# Patient Record
Sex: Female | Born: 1950 | Race: White | Marital: Married | Smoking: Never smoker
Health system: Northeastern US, Academic
[De-identification: ages and names within clinical notes are randomized; demographics above are authoritative.]

## PROBLEM LIST (undated history)

## (undated) DIAGNOSIS — E213 Hyperparathyroidism, unspecified: Secondary | ICD-10-CM

## (undated) HISTORY — DX: Hyperparathyroidism, unspecified: E21.3

---

## 2013-01-03 ENCOUNTER — Encounter: Payer: Self-pay | Admitting: Gastroenterology

## 2013-01-17 ENCOUNTER — Ambulatory Visit: Payer: Self-pay | Admitting: Surgical Oncology

## 2013-01-17 VITALS — BP 130/78 | Ht 65.55 in | Wt 133.4 lb

## 2013-01-17 DIAGNOSIS — M810 Age-related osteoporosis without current pathological fracture: Secondary | ICD-10-CM

## 2013-01-17 LAB — PTH, INTACT: Intact PTH: 59.6 pg/mL (ref 15.0–65.0)

## 2013-01-18 ENCOUNTER — Ambulatory Visit
Admit: 2013-01-18 | Discharge: 2013-01-18 | Disposition: A | Discharge status: Home / Self Care | Payer: Self-pay | Source: Ambulatory Visit | Attending: Surgical Oncology | Admitting: Surgical Oncology

## 2013-01-18 DIAGNOSIS — M810 Age-related osteoporosis without current pathological fracture: Secondary | ICD-10-CM

## 2013-01-18 LAB — ALKALINE PHOSPHATASE, BONE SPECIFIC: Alk Phos Bone Fract: 15.9 ug/L

## 2013-01-18 NOTE — Progress Notes (Signed)
This office note has been dictated.

## 2013-01-20 LAB — N-TELOPEPTIDE, URINE
Creat,Ur: 103 mg/dL
NTX Telopeptide: 65

## 2013-01-20 NOTE — Progress Notes (Signed)
PATIENTELENOR, [name]  MR #:  [id number]   ACCOUNT #:  [id number] DOB:  Jun 29, 1951   DICTATED BY:  Bebe Shaggy, PA DATE OF VISIT:  01/17/2013     CHIEF COMPLAINT:  Osteoporosis     REFERRING PHYSICIAN/PROVIDER:  Tomi Bamberger, M. D.     HISTORY OF PRESENT ILLNESS:  This pleasant 62 year old female was kindly referred for further evaluation regarding osteoporosis treatment.  She had been on Fosamax for several years but discontinued this medication approximately four years ago due to the length of time she had been on the medication.  She had a series of lab work done by Dr. Flonnie Overman recently and was found to have a low vitamin D level and was asked to start taking 2000 iu daily.  She does take calcium +D at 600 mg twice daily.  The patient has a history of pancreatitis, which she was in the hospital for approximately three years ago.  At that time, she was told that she had a high calcium level, but she is unaware of any further workup to come from that time.  She has not had any fragility fractures.  She is very active with walking as well as doing yoga.       PAST MEDICAL HISTORY:   1. Intestinal blockage - about 2005  2. Osteoporosis   3. Pancreatitis  4. Mononucleosis in 1972  5. Hepatitis in 1972      PAST SURGICAL HISTORY:   1. 1997 - Hysterectomy  2. 2008 - Knee arthroscopy  3. 2011 - Cholecystectomy      MEDICATIONS: Vitamins only.     ALLERGIES:  No known drug allergies and no known latex allergy.         FAMILY HISTORY:  Both parents deceased at unstated ages.  Mother had congestive heart failure; father had Alzheimer's and heart failure.  Two siblings, alive, with no reported medical problems. No children.        SOCIAL HISTORY:  She is married, does not live alone, and is retired.  Denies tobacco and recreational drug use; has one alcoholic drink per day.        REVIEW OF SYSTEMS:  All reported as negative.               OSTEOPOROSIS-RELATED HISTORY:  As noted, she was on Fosamax for several  years and came off it about four years ago.  She had a bone density scan done this month (see below).  Menarche around age 56 with history of regular menses. Menopause in 1997.  No pregnancies.  She was on estrogen replacement beginning in 1997 until an unknown date.  She has never taken steroids.  Takes in 1200 mg of calcium daily and very recently upped her vitamin D intake from 400 iu to 2000 iu daily. Drinks 2-3 cups of coffee per day and one to two cups of tea per week.  Has never had chemotherapy or taken anticonvulsants or diuretics.  Mother had osteoporosis; father did not.  No parental history of broken bones.  Reports a one-half inch loss in height.  Exercises by walking 3-4 miles five days per week and doing yoga twice weekly.  Diet is lactose intolerant and includes one to two servings of calcium-rich foods per day.       PHYSICAL EXAMINATION: 62 year old post-menopausal woman, alert and oriented x3 and in no acute distress.  HEENT is within normal limits.  No evidence of blue-tinted sclera or lucent  dentitia.  Spine demonstrates no noted kyphosis or scoliosis.  No pain on palpation of the spine. She walks with a steady gait and has excellent balance.  She can forward flex fingertips to toes.  Full range of motion in all extremities.  No signs of hypermobility.  Motor strength is 5/5 throughout.  Neurovascularly intact.  No signs of collagen variance.        BONE DENSITY SCAN:  This was done at [facility] on 01/03/13 and demonstrates that:             AP spine L1-L4 BMD is 0.967 with a T-score of -0.7.  This shows an 11% decline since her 2010 scan.             Right femoral neck BMD is 0.510 with a T-score of -3.0.               Right trochanter BMD is 0.587 with a T-score of -1.1.             Right total hip BMD is 0.706 with a T-score of -1.8.     METABOLIC LABS:  01/03/13             Calcium:          10.8             Vitamin D 25 OH:         23.5     ASSESSMENT: 62 year old, post-menopausal woman with  osteoporosis.  She does have evidence of hypercalcemia on the recent labs that Dr. Flonnie Overman had performed as well as the low vitamin D.     PLAN:  I have reviewed the above abnormalities with Dr. Evalee Mutton today.  As the patient is taking 1200 mg of calcium +D, we have asked her to cut this down to 600 mg of calcium once daily.  We are going to ask that she perform a 24-hour urine calcium as well as a PTH, NTX, and  BSAP  to assess for any secondary causes of the hypercalcemia at this time.  She will continue her current vitamin D3 of 2000 iu daily.  She will follow up with Dr. Evalee Mutton in approximately three to four weeks prior to her departure for [place].  Should she have any questions or concerns in the meantime, she may call the office. She was informed that she needs to have the lab work done at least two weeks prior to the follow-up visit.  We reviewed that there may be a potential of cycling her back onto Fosamax, which she took several years ago; it would be for a shorter duration on this cycle.  I've also discussed that sometimes elevated calcium levels can indicate hyperparathyroidism, and these labs will hopefully detect this if that is the case.  If so, the hyperparathyroidism would need to be addressed before proceeding with additional treatment options for the osteoporosis.       40 minutes were spent with the patient, more than 50% in counseling.             ______________________________  Bebe Shaggy, PA    ASN/MODL  DD:  01/19/2013 12:28:01  DT:  01/20/2013 05:33:41  Job #:  [id number]    cc: Bebe Shaggy, PA   [facility]   [phone] [place] [address]     Damien Fusi, MD   [address] [place], [place] [address]

## 2013-01-21 ENCOUNTER — Ambulatory Visit
Admit: 2013-01-21 | Discharge: 2013-01-21 | Disposition: A | Discharge status: Home / Self Care | Payer: Self-pay | Source: Ambulatory Visit | Attending: Orthopedic Surgery | Admitting: Orthopedic Surgery

## 2013-01-21 LAB — CALCIUM, URINE
Calcium,Ur: 14.6 mg/dL
Calcium/24H,UR: 423 mg/24hr — ABNORMAL HIGH (ref 100–240)
Collection Period: 24 h
Time End: 750
Time Start: 750
Total Volume,UR: 2900 mL

## 2013-01-24 ENCOUNTER — Encounter: Payer: Self-pay | Admitting: Orthopedic Surgery

## 2013-01-24 ENCOUNTER — Ambulatory Visit: Payer: Self-pay | Admitting: Orthopedic Surgery

## 2013-01-24 NOTE — Progress Notes (Signed)
CHIEF COMPLAINT: Osteoporosis       HISTORY OF PRESENT ILLNESS: This pleasant 62 year old female was kindly referred for further evaluation regarding osteoporosis treatment. She had been on Fosamax for several years but discontinued this medication approximately four years ago due to the length of time she had been on the medication. She had a series of lab work done by Dr. Flonnie Overman recently and was found to have a low vitamin D level and was asked to start taking 2000 iu daily. She does take calcium +D at 600 mg twice daily. The patient has a history of pancreatitis, which she was in the hospital for approximately three years ago. At that time, she was told that she had a high calcium level, but she is unaware of any further workup to come from that time. She has not had any fragility fractures. She is very active with walking as well as doing yoga.     PAST MEDICAL HISTORY:   1. Intestinal blockage - about 2005  2. Osteoporosis   3. Pancreatitis  4. Mononucleosis in 1972  5. Hepatitis in 1972   6.   PAST SURGICAL HISTORY:   1. 1997 - Hysterectomy  2. 2008 - Knee arthroscopy  3. 2011 - Cholecystectomy       MEDICATIONS: Vitamins only.   ALLERGIES: No known drug allergies and no known latex allergy.     FAMILY HISTORY: Both parents deceased at unstated ages. Mother had congestive heart failure; father had Alzheimer's and heart failure. Two siblings, alive, with no reported medical problems. No children.   SOCIAL HISTORY: She is married, does not live alone, and is retired. Denies tobacco and recreational drug use; has one alcoholic drink per day.     REVIEW OF SYSTEMS: All reported as negative.     OSTEOPOROSIS-RELATED HISTORY: As noted, she was on Fosamax for several years and came off it about four years ago. She had a bone density scan done this month (see below). Menarche around age 23 with history of regular menses. Menopause in 1997. No pregnancies. She was on estrogen replacement beginning in 1997 until an  unknown date. She has never taken steroids. Takes in 1200 mg of calcium daily and very recently upped her vitamin D intake from 400 iu to 2000 iu daily. Drinks 2-3 cups of coffee per day and one to two cups of tea per week. Has never had chemotherapy or taken anticonvulsants or diuretics. Mother had osteoporosis; father did not. No parental history of broken bones. Reports a one-half inch loss in height. Exercises by walking 3-4 miles five days per week and doing yoga twice weekly. Diet is lactose intolerant and includes one to two servings of calcium-rich foods per day.     PHYSICAL EXAMINATION: 62 year old post-menopausal woman, alert and oriented x3 and in no acute distress. Vitals signs documetned in e-record. HEENT is within normal limits. No evidence of blue-tinted sclera or lucent dentitia. Spine demonstrates no noted kyphosis or scoliosis. She walks with a steady gait and has excellent balance. Full range of motion in all extremities. No signs of hypermobility.     BONE DENSITY SCAN: This was done at [facility] on 01/03/13 and demonstrates that:   AP spine L1-L4 BMD is 0.967 with a T-score of -0.7. This shows an 11% decline since her 2010 scan.   Right femoral neck BMD is 0.510 with a T-score of -3.0.   Right trochanter BMD is 0.587 with a T-score of -1.1.   Right total hip  BMD is 0.706 with a T-score of -1.8.     METABOLIC LABS: 01/03/13   Calcium: 10.8   Vitamin D 25 OH: 23.5   PTH: 59.6  24 urine: 423  NTX: 65      ASSESSMENT: 62 year old, post-menopausal woman with osteoporosis. She does have evidence of hypercalcemia and very high urine calcium.    PLAN: I am going to stop her calcium and Vit D at this time. I am going to refer her to endocrinology and we will hold off starting any treatment for her osteoporosis until she is evaluated by endocrinology. She will follow-up after eval by endocrine.    Over 25 minutes was spent in face to face time with the patient today and >50% was in counseling and  coordination of care.

## 2013-01-25 ENCOUNTER — Telehealth: Payer: Self-pay

## 2013-01-25 NOTE — Telephone Encounter (Signed)
Carmodysoni, Gerome Apley, MD is referring pt for hypercalcemia  Review- Chart view

## 2013-01-26 NOTE — Telephone Encounter (Signed)
NA

## 2013-01-27 NOTE — Telephone Encounter (Signed)
1st attempt left message on patient voicemail

## 2013-01-30 NOTE — Telephone Encounter (Signed)
Spoke with pt scheduled her for 2/5@ 1320 with Dr. Annye Rusk  Referring office notified

## 2013-02-01 ENCOUNTER — Ambulatory Visit: Payer: Self-pay | Admitting: Endocrinology

## 2013-02-01 ENCOUNTER — Encounter: Payer: Self-pay | Admitting: Endocrinology

## 2013-02-01 VITALS — BP 163/89 | HR 91 | Ht 66.0 in | Wt 133.0 lb

## 2013-02-01 DIAGNOSIS — E210 Primary hyperparathyroidism: Secondary | ICD-10-CM | POA: Insufficient documentation

## 2013-02-01 LAB — BASIC METABOLIC PANEL
Anion Gap: 9 (ref 7–16)
CO2: 29 mmol/L — ABNORMAL HIGH (ref 20–28)
Calcium: 10.4 mg/dL — ABNORMAL HIGH (ref 8.6–10.2)
Chloride: 103 mmol/L (ref 96–108)
Creatinine: 0.78 mg/dL (ref 0.51–0.95)
GFR,Black: 96 *
GFR,Caucasian: 83 *
Glucose: 83 mg/dL (ref 60–99)
Lab: 14 mg/dL (ref 6–20)
Potassium: 5.3 mmol/L — ABNORMAL HIGH (ref 3.3–5.1)
Sodium: 141 mmol/L (ref 133–145)

## 2013-02-01 LAB — PHOSPHORUS: Phosphorus: 3.4 mg/dL (ref 2.7–4.5)

## 2013-02-01 LAB — PTH, INTACT: Intact PTH: 77.9 pg/mL — ABNORMAL HIGH (ref 15.0–65.0)

## 2013-02-01 LAB — ALBUMIN: Albumin: 4.8 g/dL (ref 3.5–5.2)

## 2013-02-01 NOTE — Progress Notes (Signed)
Endocrine Consult Note  [name] is a 62 y.o.  woman seen on 02/01/2013 for consultation regarding hypercalcemia and osteoporosis.    History of Present Illness:  In Jan 2011, adm at [facility] for pancreatitis due to gallstones - Dr Miquel Dunn; treated with med for pancreatitis and was told calcium was "really high" - was on cal BID (total 1200 mg/day) and D 400 every day.    Has had at least 3, maybe 4, DXAs, at [facility]; was told had areas of osteoporosis (OP) and osteopenia. (recently changed to Dr Shela Commons. Flonnie Overman)    Took alen for several yrs (one note states started 2005), stopped about 4 yrs ago (2009-10) after discussing with Dr Marcy Salvo (PCP at the time). Not on any OP meds since.    Off cal and D since last Tues after seeing Dr Evalee Mutton.    Has not been told of high cal since [facility] 3 yrs ago until Jan 2014.    Had thyroid checked by prev PCP due to hair loss. Was told thyroid levels were normal. Has never needed thyroid medication.    This was 1st 24 hr urine collection. No h/o kidney stones.     No fractures.     1997 - complete hysterectomy with BSO for endometriosis. Took estradiol for sev yrs, gradually weaned off. Has been off for at least 5 yrs.    No known fam with high cal or kidney stones. Mother with osteoporosis; no known fx.    Planning to go to [facility] for 6 weeks in late Feb.    Patient's medications, allergies, past medical, surgical, social and family histories were reviewed and updated as appropriate.     Review of Systems:  Constitutional: fever: no, chills: no, excessive fatigue: no, unintentional weight gain: no, night sweats: yes, and unintentional weight loss: no.  Eyes: blurry vision: no, redness: no, double vision: no, dryness: yes, R eye; irritation: no, and loss of vision: no.  Ear/nose/throat: dry mouth: no, runny nose: no, ringing in ears: no, change in voice: no, sore throat: no, mouth sore: no, heavy snoring: no, hearing loss: no, and sinus congestion: no.  Heart/Chest:  discomfort: no, pressure: no, rapid heart rate: no, calf pain with walking: no, fainting: no, and leg swelling: no.  Lungs/breathing: cough: no, wheezing: no, difficulty breathing: no, and sputum: no.  Stomach/bowel: diarrhea: no, constipation: no, nausea/vomiting: no, belly pain: no, heart burn: no, change in appetite: no, difficulty swallowing: no, and blood in stool/black stool: no.  Kidneys: difficulty with urination: no, very thirsty: no, pain with urination: no, frequent urination: no, blood in urine: no, urination at night: no, incomplete emptying: no, and leakage of urine: no.  Breast: pain: no and discharge: no.  Female/female organs: irregular period: no, decreased libido: no, and decreased sexual function: yes  Muscles: decreased strength: no and cramping/pain: no.  Skin: rash: no, ulcer: no, stretch marks: no, callous: no, and new/changing moles: no.  Neurological: memory loss: no, tremor: no, muscle weakness: no, involuntary movement: no, falls: no, dizziness: no, numbness/tingling: no, and frequent/severe headaches: no.  Psychosocial: insomnia: occa, feeling sad or depressed: no, panic: no, and anxiety/nervousness: no.  Endocrine: hot flashes: yes, excessive thirst: no, and heat/cold intolerance: no.  Blood/lymphatic: easy bleeding: no, anemia: no, swollen lymph nodes: no, and excessive bruising: no.  Allergy: severe allergic reactions: no, hives: no, and frequent infections: no.    Physical Exam:  Blood pressure 163/89, pulse 91, height 1.676 m (5\' 6" ), weight 60.328  kg (133 lb).  GENERAL APPEARANCE:  NAD, pleasant  HEENT:  EOMI, no exophthalmos, sclerae clear.  NECK: supple, no thyromegaly or lymphadenopathy. No palpable thyroid nodules.  CHEST:  lungs clear, no rales, rhonchi or wheezes  HEART:  RRR, normal s1, s2  BACK:  no spinal or CVA tenderness  ABDOMEN: soft, NT, ND, BS positive  EXTREMITIES: no edema  NEUROLOGICAL: alert, oriented, no tremors with extended arms.    SKIN: normal thickness, no  striae, rashes    Labs and Studies:  01/03/13 OSH DXA: R fem neck T -3, R tot hip -1.8, L1-L4 -0.7. OSH labs: Cal 10.8 (8.5-10.4), alb 4.8, Cr 0.7, 25D 23.5  01/17/13: [place] PTH 59.6 (no concomitant cal)  01/18/13: NTX 65  01/21/13: 24hr ur cal 423 mg/d.    Assessment and Plan:  Ms [name] is a 62 y.o. lady with h/o osteoporosis, found to have by hypercalcemic.    1. Hypercalcemia. Discussed that she most likely has primary hyperparathyroidism (PHPT) given that she has hypercalcemia, and a PTH done was in the high normal range (though the PTH should have been accompanied by a concomitant calcium).  - Reviewed that PHPT is assoc with bone loss/osteoporosis, hypercalciuria/nephrolithiasis, and potential cardiac and neurological symptoms.  - While her calcium was not dangerously high, she has other signs of PHPT - osteoporosis and hypercalciuria.   - Thus discussed that the main treatment for PHPT is surgical and so will refer her to Dr Judge Stall, whose practice focuses of endocrine surgery. Greatly apprec his expertise.  - Will check labs today to ensure her cal has not cont'd to climb.  - Discussed that since she has had hypercalcemia for several years, she can likely go to [facility] and then return for surgery. However, will be able to determine this better after her labs, and she will also discuss with Dr Charisse Klinefelter.  - Very briefly discussed imaging studies like USG and sestamibi scan, but will defer to Dr Charisse Klinefelter for imaging recs.    2. Osteoporosis. S/p alendronate for several yrs. On her most recent DXA from 01/03/13, only the R fem neck was osteoporotic but her hip was osteopenic. Would hold on addn osteoporosis treatment pending mgmt of the PHPT.    We will determine her follow up with me after the results are available.    Labs soon:   Orders Placed This Encounter   Procedures   . Basic metabolic panel   . Vitamin d 25 hydroxy, d2&d3   . PTH, intact   . Albumin   . Phosphorus     Labs before next appt: TBD

## 2013-02-01 NOTE — Patient Instructions (Addendum)
I will get in touch with Dr Judge Stall, the parathyroid surgeon, and his office will get in touch with you about making an appt.    We will check labs today - ok that they're not fasting.    For now, remain off calcium and vit D.    Please call with questions: Dr Alesia Banda, phone [phone]

## 2013-02-04 ENCOUNTER — Encounter (INDEPENDENT_AMBULATORY_CARE_PROVIDER_SITE_OTHER): Payer: Self-pay

## 2013-02-06 ENCOUNTER — Ambulatory Visit: Payer: Self-pay | Admitting: Surgical Oncology

## 2013-02-06 ENCOUNTER — Encounter: Payer: Self-pay | Admitting: Surgical Oncology

## 2013-02-06 VITALS — BP 159/73 | HR 72 | Temp 97.4°F | Resp 16 | Ht 66.0 in | Wt 132.0 lb

## 2013-02-06 DIAGNOSIS — E210 Primary hyperparathyroidism: Secondary | ICD-10-CM

## 2013-02-06 LAB — VITAMIN D
25-OH VIT D2: <4 ng/mL
25-OH VIT D3: 22 ng/mL
25-OH Vit Total: 22 ng/mL — ABNORMAL LOW (ref 30–60)

## 2013-02-06 NOTE — Progress Notes (Signed)
Dear DR. Annye Burns,    It was my pleasure to meet [name] in consultation regarding [name] elevated parathyroid level. Thank you for referring [name] to my endocrine surgery practice. Since you know [name] so well, please allow me the privilege of reviewing [name] history for the completeness of my own office record.    [name] complains of osteoporosis/osteopenia.    [name]  has a past medical history of Hyperparathyroidism.    She  has past surgical history that includes Hysterectomy, total abdominal.    She has a current medication list which includes the following prescription(s): lysine, glucosamine 1500 complex, vitamin c, aspirin, calcium-vitamin d, and cholecalciferol.    She has No Known Allergies (drug, envir, food or latex).    She  reports that she has never smoked. She does not have any smokeless tobacco history on file.    [name]  reports that she does not drink alcohol.    I did a comprehensive review of systems using the Pre Op H&P note template.    On physical examination, this is a well-developed, well-nourished female who is in no acute distress.  The patient communicates clearly, and has a normal affect. [name] body mass index is 21.32 kg/(m^2). [name]'s  height is 1.676 m (5\' 6" ) and weight is 59.875 kg (132 lb). [name] temporal temperature is 36.3 C (97.4 F). [name] blood pressure is 159/73 and [name] pulse is 72. [name] respiration is 16.  There is no proptosis or exophthalmos. The thyroid gland is normal is not palpable. There is no palpable lymphadenopathy. [name]'s sign is negative. Lungs are clear. Heart is regular. Abdomen is soft, nontender, and nondistended without organomegaly. There is no lower extremity edema and no rashes.  Rectal and breast exams are deferred. Neurologically, the patient is grossly intact.    Review of pertinent laboratory values is as follows:  01/21/2013: Calcium/24H,UR 423 mg/24hr* (High; Range: 100 - 240 mg/24hr)    02/01/2013: Calcium 10.4 mg/dL* (High; Range: 8.6 - 16.1 mg/dL)       I  reviewed [name] laboratory studies with [name] in person. Next, I went on to discuss with [name] the physiologic function of the parathyroid gland and the difference between primary and secondary hyperparathyroidism. I emphasized to [name] that the diagnosis of primary versus secondary hyperparathyroidism is often difficult to establish definitively.      I had a very nice and detailed discussion with [name]/[name] family. We discussed [name] options in the management of this problem - monitoring with repeated blood work, ultrasound, and other imaging studies. Additionally, the risks and benefits of thyroid surgery, including scar formation, postoperative bleeding, recurrent laryngeal nerve injury (unilateral and bilateral) and hypoparathyroidism, as well as the ramifications of these complications, were all discussed in great detail with the patient who understood that these complications are uncommon, but nevertheless do occur despite every effort at their prevention.      At the conclusion of the visit, the patient was much better informed regarding the nature of [name] problem.      At the conclusion of the visit, [name] wished to proceed with left lower parathyroid surgery tomorrow. Of course, I will keep you posted on my ongoing findings and on the patient's condition.    Thank you very much for sending this delightful patient to me and for allowing me to participate in [name] care.  I will keep you up-to-date on [name] progress.      Best wishes,  Theresa Denver, NP  Addendum [name])    I agree with [name]'s note as dictated above.  It is reflective of my history and physical and assessment of [name].  Given [name] osteoporosis, surgery is clearly appropriate.   Will do the case tomorrow at [facility].  In the office, I did an US exam.  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      After a detailed conversation which included the risks, benefits, and  alternatives to surgery, [name] wished for the operation to take place soon.   Specifically, we discussed the risks of scar formation (although every effort will be made to minimize the cosmetic impact of the surgery), bleeding (which occurs in far less than 1% of patients).  Additionally, we discussed the risks and ramifications of RLN injury, which occurs in less than 1% of patients. Finally, I explained that traditional parathyroidectomy was curative in 97% of patients, and that minimal access surgery, which I plan for [name], approximates this nicely, with a 96% cure rate.      Surgery will take place tomorrow per [name] wishes.  Of course, I will keep you updated on [name] progress.      Many thanks for referring this delightful patient to me,     Sincerely,    Theresa Stall, MD, [facility]  Assistant Professor of Endocrine Surgery and Endocrinology  [facility]  [address], [place], [place]  [address]  [phone] - office  [phone] - fax

## 2013-02-07 ENCOUNTER — Other Ambulatory Visit: Payer: Self-pay | Admitting: Gastroenterology

## 2013-02-07 ENCOUNTER — Ambulatory Visit: Payer: Self-pay | Admitting: Orthopedic Surgery

## 2013-02-07 ENCOUNTER — Ambulatory Visit
Admit: 2013-02-07 | Disposition: A | Discharge status: Home / Self Care | Payer: Self-pay | Source: Ambulatory Visit | Attending: Surgical Oncology | Admitting: Surgical Oncology

## 2013-02-07 LAB — COMPREHENSIVE METABOLIC PANEL
ALT: 25 U/L (ref 0–35)
AST: 26 U/L (ref 0–35)
Albumin: 4.9 g/dL (ref 3.5–5.2)
Alk Phos: 69 U/L (ref 35–105)
Anion Gap: 11 (ref 7–16)
Bilirubin,Total: 0.6 mg/dL (ref 0.0–1.2)
CO2: 27 mmol/L (ref 20–28)
Calcium: 9.8 mg/dL (ref 8.6–10.2)
Chloride: 103 mmol/L (ref 96–108)
Creatinine: 0.64 mg/dL (ref 0.51–0.95)
GFR,Black: 113 *
GFR,Caucasian: 98 *
Globulin: 2.5 g/dL — ABNORMAL LOW (ref 2.7–4.3)
Glucose: 95 mg/dL (ref 60–99)
Lab: 15 mg/dL (ref 6–20)
Potassium: 4.3 mmol/L (ref 3.3–5.1)
Sodium: 141 mmol/L (ref 133–145)
Total Protein: 7.4 g/dL (ref 6.3–7.7)

## 2013-02-07 LAB — RAPID PTH
Intraoperative PTH: 57.3 pg/mL (ref 15.0–65.0)
Intraoperative PTH: 29 pg/mL (ref 15.0–65.0)
Intraoperative PTH: 37.9 pg/mL (ref 15.0–65.0)
Intraoperative PTH: 51.1 pg/mL (ref 15.0–65.0)
Intraoperative PTH: 93.5 pg/mL — ABNORMAL HIGH (ref 15.0–65.0)

## 2013-02-07 LAB — CBC
Hematocrit: 40 % (ref 34–45)
Hemoglobin: 12.9 g/dL (ref 11.2–15.7)
MCV: 94 fL (ref 79–95)
Platelets: 206 10*3/uL (ref 160–370)
RBC: 4.3 MIL/uL (ref 3.9–5.2)
RDW: 13.3 % (ref 11.7–14.4)
WBC: 9.5 10*3/uL (ref 4.0–10.0)

## 2013-02-07 LAB — EKG 12-LEAD
P: 67 degrees
QRS: 91 degrees
Rate: 76 {beats}/min
Statement: BORDERLINE
T: 15 degrees

## 2013-02-07 LAB — CALCIUM: Calcium: 9.3 mg/dL (ref 8.6–10.2)

## 2013-02-07 LAB — PTH, INTACT: Intact PTH: 12.6 pg/mL — ABNORMAL LOW (ref 15.0–65.0)

## 2013-02-07 MED ORDER — CEFAZOLIN SODIUM-2000 MG DEXTROSE 3% DUPLEX IV *I*
2000.0000 mg | INTRAVENOUS | Status: AC
Start: 2013-02-07 — End: 2013-02-07
  Administered 2013-02-07: 2000 mg via INTRAVENOUS

## 2013-02-07 MED ORDER — CEFAZOLIN SODIUM 1000 MG IJ SOLR *I*
INTRAMUSCULAR | Status: AC
Start: 2013-02-07 — End: 2013-02-07
  Filled 2013-02-07: qty 20

## 2013-02-07 MED ORDER — LACTATED RINGERS IV SOLN *I*
20.0000 mL/h | INTRAVENOUS | Status: DC
Start: 2013-02-07 — End: 2013-02-07

## 2013-02-07 MED ORDER — HYDROCODONE-ACETAMINOPHEN 5-325 MG PO TABS *I*
1.0000 | ORAL_TABLET | Freq: Four times a day (QID) | ORAL | Status: AC | PRN
Start: 2013-02-07 — End: 2013-03-09

## 2013-02-07 MED ORDER — LIDOCAINE HCL 1 % IJ SOLN
0.1000 mL | INTRAMUSCULAR | Status: DC | PRN
Start: 2013-02-07 — End: 2013-02-07
  Administered 2013-02-07: 0.1 mL via SUBCUTANEOUS

## 2013-02-07 MED ORDER — HYDROCODONE-ACETAMINOPHEN 5-325 MG PO TABS *I*
1.0000 | ORAL_TABLET | Freq: Four times a day (QID) | ORAL | Status: DC | PRN
Start: 2013-02-07 — End: 2013-02-07

## 2013-02-07 MED ORDER — SODIUM CHLORIDE 0.9 % IV SOLN WRAPPED *I*
20.0000 mL/h | Status: DC
Start: 2013-02-07 — End: 2013-02-07

## 2013-02-07 MED ORDER — HYDROCODONE-ACETAMINOPHEN 5-325 MG PO TABS *I*
1.0000 | ORAL_TABLET | Freq: Four times a day (QID) | ORAL | Status: DC | PRN
Start: 2013-02-07 — End: 2013-02-08
  Administered 2013-02-07: 1 via ORAL
  Filled 2013-02-07: qty 1

## 2013-02-07 MED ORDER — HYDROMORPHONE HCL PF 1 MG/ML IJ SOLN *WRAPPED*
0.4000 mg | INTRAMUSCULAR | Status: DC | PRN
Start: 2013-02-07 — End: 2013-02-07

## 2013-02-07 MED ORDER — ONDANSETRON HCL 2 MG/ML IV SOLN *I*
1.0000 mg | Freq: Once | INTRAMUSCULAR | Status: DC | PRN
Start: 2013-02-07 — End: 2013-02-07

## 2013-02-07 MED ORDER — CALCIUM CARBONATE ANTACID 500 MG PO CHEW *I*
2.0000 | CHEWABLE_TABLET | Freq: Every day | ORAL | Status: AC
Start: 2013-02-07 — End: 2013-03-09

## 2013-02-07 MED ORDER — PROMETHAZINE HCL 25 MG/ML IJ SOLN *I*
6.2500 mg | Freq: Once | INTRAMUSCULAR | Status: DC | PRN
Start: 2013-02-07 — End: 2013-02-07

## 2013-02-07 MED ORDER — LACTATED RINGERS IV SOLN *I*
50.0000 mL/h | INTRAVENOUS | Status: DC
Start: 2013-02-07 — End: 2013-02-07
  Administered 2013-02-07: 50 mL/h via INTRAVENOUS

## 2013-02-07 NOTE — Op Note (Signed)
[name], [name] MR #:  [id number]   ACCOUNT #:  [id number] DOB:  1951/09/03    AGE:  61     SURGEON:  Judge Stall, MD  CO-SURGEON:    ASSISTANT:  Baldemar Friday, MD, MPH, RES.  SURGERY DATE:  02/07/2013    PREOPERATIVE DIAGNOSIS:  Primary hyperparathyroidism.    POSTOPERATIVE DIAGNOSIS:  Primary hyperparathyroidism.    OPERATIVE PROCEDURE:  Parathyroidectomy, deep cervical lymph node biopsy.    ANESTHESIA:  General.    IV FLUIDS:  1500 cc.    ESTIMATED BLOOD LOSS:  Minimal.    COMPLICATIONS:  None apparent.    FINDINGS:  Left lower parathyroid adenoma.    DESCRIPTION OF PROCEDURE:  After the successful induction of general endotracheal anesthesia using a nerve monitor monitoring tube, grounding wires were placed in the usual fashion and the patient's neck was extended.  Her arms were carefully tucked at her side.  All joints and contact surfaces were carefully padded to avoid undue stress or strain.  A bilateral superficial cervical nerve block was placed and the neck was prepped and draped.     A 4 cm Kocher incision was made in a skin crease.  This was deepened through the platysma muscle sharply and subplatysmal planes were raised.  The median raphe was incised.  The left sternohyoid muscle was elevated off the sternothyroid, which in turn was elevated off the thyroid gland itself.  In the cleft between these, the internal jugular vein was identified.  This was the source for all intraoperative selective venous sampling aspirates.     The left tracheoesophageal groove was thoroughly explored.  Just caudal to the thyroid was a clear-cut parathyroid adenoma.  The upper parathyroid gland was not seen.  The lower gland was meticulously dissected and excised.  On the back table, it measured 19 x 12 x 7 mm and was clearly hypercellular.     Ultimately, the patient's baseline PTH level returned 57.3.  The preexcision PTH level bumped to 93.5, but 5, 10 and 15 minutes progressively declined to 51.1, 37.9 and  29, reflecting a steady decline as well as normalization of the PTH level.  With this information at hand, the operation was concluded.  After hemostasis was ensured, the muscular layers were reapproximated using interrupted Vicryl suture and the skin edges were reapproximated using 4-0 Monocryl running subcuticular stitch.  Dermabond was applied as a sterile dressing.     I was present and scrubbed for all parts of the operative procedure.  All needle, instrument, and sponge counts were verified correct by the OR staff x2.  The patient tolerated the procedure well.  She is being weaned from anesthesia at the time of this dictation, with plans for extubation and same-day discharge.             ______________________________  Judge Stall, MD    JM/MODL  DD:  02/07/2013 13:37:29  DT:  02/07/2013 14:25:47  Job #:  [id number]    cc: Judge Stall, MD   [facility]   [phone] [place] [address]     Alesia Banda     Damien Fusi, MD   [address]   [place], [place] [address]     Cristopher Estimable, MD  EXTERNAL RECIPIENT

## 2013-02-07 NOTE — Progress Notes (Signed)
Pt up in chair.  Pt requested soup and jello.  Pt stated comfortable, waiting for husband and for 2000.  Incision site well approximated/intact, no swelling or bruising.

## 2013-02-07 NOTE — Progress Notes (Signed)
Pt notes numb lip.  In fact she has numbness on left inferior lip and down chin, perhaps from nerve block or from difficult intubation.  No evidence of hypocalemic problem.

## 2013-02-07 NOTE — Discharge Instructions (Signed)
Brief Summary of your Hospital Stay (including key procedures and diagnostic test results):  Admitted for LEFT parathyroidectomy. Tolerated procedure well without complication.  Discharged home in satisfactory condition post-operatively.    Pending Labs: Surgical pathology    Call Dr. Charisse Klinefelter for Fever of 101F. or greater  Increased redness, drainage or swelling from the incision site  If you cannot reach the provider above, call the doctor's answering service.    Other Instructions: Dr. Geralynn Rile office will contact you regarding follow up.  Take 1 gm of calcium daily.  If you experience perioral numbness or cramps in hands/feet take extra calcium and call the office.    Diet: Regular    Activity: No restrictions    You have received sedative medication and/or general anesthesia which may make you drowsy for as long as 24 hours:  A) DO NOT drive or operate [place] machinery for 24 hours  B) DO NOT drink alcoholic beverages for 24 hours  C) DO NOT make major decisions, sign contracts, etc. for 24 hours    Wound Care: Wash incision with antibacterial soap  Take a shower, pat incision dry  Do not bathe or immerse in water for 2 weeks    Pain Management: Resume home medications  Do not take any Tylenol or Tylenol (acetaminophen) products while on Norco your pain medication

## 2013-02-07 NOTE — Progress Notes (Signed)
Dr Rickard Rhymes spoke with Dr Ted Mcalpine and Dr Charisse Klinefelter.  Per Dr Rickard Rhymes monitor Pt, trial Pt ambulating, then may discharge Pt to home at 2015 if feeling baseline.  Reported Pt off to [employer].

## 2013-02-07 NOTE — Progress Notes (Signed)
Pt awake, ambulate to bathroom with assist.  Pt back in bed.  Pt declines any pain meds at this time, stated "comfortable".  Pt tolerating PO fluids.  Ice intact at incision site.  Pt resting comfortable, denies any problems.

## 2013-02-07 NOTE — Progress Notes (Signed)
Pt complained of lower middle lip "tingling/numbness".  Writer observed small bruise/lump on lower middle lip, notified Dr Baldemar Friday.  Per Dr Lu Duffel "probably nerve block, Calcium normal range, no further intervention ordered."  Dr Tressia Danas notified, examined Pt, no further interventions ordered.  Dr Lu Duffel wrote discharge order for 2000.

## 2013-02-07 NOTE — Progress Notes (Addendum)
Pt ambulates throughout hallway with Clinical research associate and another Charity fundraiser.  Pt does not feel faint or dizzy and vital signs are stable.  Pt verbalizes understanding of discharge instructions and is discharged home now.  Shyrl Numbers, RN.    Addendum 2029, IV discontinued and pt was dressed, felt some phlegm in her throat. Pt was able to cough up small amounts of phlegm- pt denies any change in breathing or ability to swallow.  Pt discharged home at this time.  Shyrl Numbers, RN.

## 2013-02-07 NOTE — Preop H&P (Signed)
UPDATES TO PATIENT'S CONDITION on the DAY OF SURGERY/PROCEDURE    I. Updates to Patient's Condition (to be completed by a provider privileged to complete a H&P, following reassessment of the patient by the provider):    Full H&P done today; no updates needed.            II. Procedure Readiness   I have reviewed the patient's H&P and updated condition. By completing and signing this form, I attest that this patient is ready for surgery/procedure.      III. Attestation   I have reviewed the updated information regarding the patient's condition and it is appropriate to proceed with the planned surgery/procedure.    Judge Stall, MD as of 11:05 AM 02/07/2013

## 2013-02-07 NOTE — Progress Notes (Signed)
Pt has congestion and per pt it is a cold. She is coughing frequently and sniffing. Her voice is hoarse. Pt states that she does not have a fever nor has she a fever since her cold began.

## 2013-02-07 NOTE — Anesthesia Post-procedure Eval (Signed)
Anesthesia Post-op Note    Patient: [name]    Procedure(s) Performed:Parathyroidectomy, deep cervical lymph node biopsy    Anesthesia type: General    Patient location: PACU    Mental Status: Recovered to baseline    Patient able to participate in this evaluation: yes  Last Vitals: BP: 134/74 mmHg (02/07/13 1633)  BP MAP : 90 mmHg (02/07/13 1415)  Heart Rate: 82 (02/07/13 1633)  Temp: 37 C (98.6 F) (02/07/13 1415)  Resp: 16 (02/07/13 1633)  Height: 167.6 cm (5\' 6" ) (02/07/13 0953)  Weight: 57.607 kg (127 lb) (02/07/13 0953)  BMI (Calculated): 20.5 (02/07/13 0953)  SpO2: 98 % (02/07/13 1633)      Post-op vital signs noted above are within patient's normal range  Post-op vitals signs: stable  Respiratory function: baseline    Airway patent: Yes    Cardiovascular and hydration status stable: Yes    Post-Op pain: Adequate analgesia    Post-Op nausea and vomiting: none    Post-Op assessment: no apparent anesthetic complications, tolerated procedure well and no evidence of recall    Complications: none    Attending Attestation: All indicated post anesthesia care provided    Author: Cherlyn Labella, MD  as of: 02/07/2013  at: 4:36 PM

## 2013-02-07 NOTE — Progress Notes (Signed)
Dr Charisse Klinefelter to see Pt, reviewed lab results.  Per Dr Charisse Klinefelter may be discharged at 2000.  Pt stated "sore throat", asked for one pain pill, medicated.  Pt tolerating crackers and juice.

## 2013-02-07 NOTE — INTERIM OP NOTE (Signed)
Interim Op Note    Date of Surgery: 02/07/13  Surgeon: Charisse Klinefelter  Co-Surgeon: Lu Duffel  First Assistant:   Second Assistant:     Pre-Op Diagnosis: pHPT    Anesthesia Type: General    Post-Op Diagnosis:    Primary: same  Secondary:   Tertiary:     Additional Findings (Including unexpected complications): none    Procedure(s) Performed (including CPT 4 Code if available)   parathyroidectomy (5)    Estimated Blood Loss: min   Packing: No  Drains: No  Fluid Totals: Intakes: 1500 Outputs:   Specimens to Pathology: yes  Patient Condition: good

## 2013-02-07 NOTE — Anesthesia Pre-procedure Eval (Signed)
Anesthesia Pre-operative Evaluation for [name]      Health History  Past Medical History   Diagnosis Date   . Hyperparathyroidism      Past Surgical History   Procedure Laterality Date   . Hysterectomy, total abdominal       Social History  History   Substance Use Topics   . Smoking status: Never Smoker    . Smokeless tobacco: Not on file   . Alcohol Use: No      History   Drug Use No     ______________________________________________________________________  Allergies: No Known Allergies (drug, envir, food or latex)  Prior to Admission Medications              Last Dose Start Date End Date Provider     Ascorbic Acid (VITAMIN C) 500 MG tablet   --  --  [provider]     Calcium-Vitamin D 600-200 MG-UNIT per tablet   --  --  [provider]     Glucosamine-Chondroit-Vit C-Mn (GLUCOSAMINE 1500 COMPLEX) CAPS   --  --  [provider]     Lysine 1000 MG TABS   --  --  [provider]     aspirin 81 MG tablet   --  --  [provider]     cholecalciferol (VITAMIN D) 2000 UNITS tablet   --  --  [provider]        No current facility-administered medications for this encounter.     Current Outpatient Prescriptions   Medication Sig   . Calcium-Vitamin D 600-200 MG-UNIT per tablet Take 1 tablet by mouth daily   . Lysine 1000 MG TABS Take by mouth   . Glucosamine-Chondroit-Vit C-Mn (GLUCOSAMINE 1500 COMPLEX) CAPS Take by mouth   . Ascorbic Acid (VITAMIN C) 500 MG tablet Take 500 mg by mouth daily   . aspirin 81 MG tablet Take 81 mg by mouth daily   . cholecalciferol (VITAMIN D) 2000 UNITS tablet Take 2,000 Units by mouth daily     Admission Medications:  Scheduled Meds   IV Meds   PRN Meds     Anesthesia EvaluationInformation Source: per patient, per records  General  Pertinent (-):  history of anesthetic complications or FamHx of anesthetic complications    HEENT    + Corrective Eyewear            glasses    Pulmonary   Denies pulmonary issues  Cardiovascular  Good(4+METs) Exercise Tolerance    GI/Hepatic/Renal  Last PO Intake: >8hr before procedure     Neuro/Psych    Denies neuro/psych issues    Endo/Other     Comment:  Hyperparathyroidism    Hematologic    + Anticoagulants            ASA     Nursing Reported PO Status:           ______________________________________________________________________  Physical Exam    Airway            Mouth opening: normal            Mallampati: II            TM distance (fb): >3 FB            Neck ROM: full  Dental   Normal Exam   Cardiovascular           Rhythm: regular           Rate: normal  General Survey    Normal Exam   Pulmonary   pulmonary exam normal    Mental Status   Normal  evaluation         Most Recent Vitals:      Vital Sign Ranges (last 24hrs)           Most Recent Lab Results   Blood Type  No results found for this basename: aborh, abs   CBC  No results found for this basename: WBC, WBCM, HCT, PLT   Chem-7  Lab Results   Component Value Date    NA 141 02/01/2013    K 5.3* 02/01/2013    CL 103 02/01/2013    CO2 29* 02/01/2013    UN 14 02/01/2013    CREAT 0.78 02/01/2013    GLU 83 02/01/2013   The CrCl is unknown because both a height and weight (above a minimum accepted value) are required for this calculation.  Electrolytes  Lab Results   Component Value Date    CA 10.4* 02/01/2013    PO4 3.4 02/01/2013   Coags  No results found for this basename: PTI, INR, PTT   LFTs  No results found for this basename: AST, ALT, ALK      Pregnancy Status:   No LMP recorded.    No results found for this basename: PUPT, UPREG, SPREG, HCG1, BHCG2, BHCG, HCGB     ECG Results  No results found for this basename: rate, PR, statement     ANES CPM    Radiology: Recent Study Impressions No results found.    ________________________________________________________________________  Medical Problems  Patient Active Problem List    Diagnosis Date Noted   . Primary hyperparathyroidism 02/01/2013       PreOp/PreProcedure Diagnosis (For more detail  see procedural consent)            Hyperparathyroidism  Planned Procedure (For more detail see procedural consent)            Parathyroidectomy  Plan   ASA Score 2  Anesthetic Plan (general); Induction (routine IV); Airway (- NIM tube and monitor); Line ( use current access); Monitoring (standard ASA); Positioning (supine); Pain (per surgical team); PostOp (PACU)    Informed Consent     Risks:          Risks discussed were commensurate with the plan listed above with the following specific points:  N/V, aspiration, sore throat , damage to:(eyes, nerves, teeth), allergic Rx, unexpected serious injury    Anesthetic Consent:         Anesthetic plan and risks discussed with:  patient    Plan discussed with:  surgeon      Attending Attestation: The patient or proxy understand and accept the risks and benefits of the anesthesia plan. By accepting this note, I attest that I have personally performed the history and physical exam and prescribed the anesthetic plan within 48 hours prior to the anesthetic as documented by me above.    Author: Cherlyn Labella, MD

## 2013-02-07 NOTE — Progress Notes (Signed)
At approx. 1835 Pt had 2 sips of soup, complained "I feel nausea, I don't feel good".  In recliner, Pt had vaso - vagal episode, unresponsive, + breathing.  Writer lowered head down, used Ammonia capsul, Pt opened eyes, IVF wide open, heart rate 40's, BP 79/50, Pt pale, clammy.  See vitals flow sheet.  Dr Charise Killian to examine Pt.  Pt felt better within a few minutes.  Writer notified Dr Cherlyn Labella, stated Pt with history of this post-op and low cardiac post op.  Pt confirmed history.  Per Dr Ted Mcalpine admit Pt overnight.  Dr Rickard Rhymes ([facility]) to evaluate  Pt.  Continue to monitor.  Pt stated "I feel better"

## 2013-02-08 LAB — SURGICAL PATHOLOGY

## 2013-02-10 ENCOUNTER — Ambulatory Visit: Payer: Self-pay | Admitting: Surgical Oncology

## 2013-02-10 ENCOUNTER — Encounter: Payer: Self-pay | Admitting: Surgical Oncology

## 2013-02-10 VITALS — BP 145/78 | HR 78 | Temp 96.7°F | Resp 16 | Ht 66.0 in | Wt 133.0 lb

## 2013-02-10 DIAGNOSIS — E210 Primary hyperparathyroidism: Secondary | ICD-10-CM

## 2013-02-10 NOTE — Progress Notes (Signed)
Dear DR. Annye Rusk,    It was my pleasure to see [name] in follow -up after her left lower parathyroidectomy . Thank you for referring her to my endocrine surgery practice. As you know, surgery itself was uneventful. Her incision has healed beautifully.    At the time of surgery a Left lower parathyroid gland was removed,  it measured 19 x 12 x 7 mm and was clearly hypercellular. Ultimately, the patient's baseline PTH level returned 57.3. The preexcision PTH level bumped to 93.5, but 5, 10 and 15 minutes progressively declined to 51.1, 37.9 and 29, reflecting a steady decline as well as normalization of the PTH level.     [name] comes in today for her post operative visit. She states she feels "good". Her repate labs are WNL.  I will see her back in 6 months in continued follow up.     [name] has recovered very nicely from surgery. Thank you so much for sending this patient to me and for allowing me to participate in her care.        Sincerely,  Dametra Whetsel B Zayneb Baucum, NP

## 2013-02-11 ENCOUNTER — Encounter: Payer: Self-pay | Admitting: Endocrinology

## 2013-03-09 ENCOUNTER — Encounter: Payer: Self-pay | Admitting: Surgical Oncology

## 2013-03-23 ENCOUNTER — Encounter: Payer: Self-pay | Admitting: Surgical Oncology

## 2013-03-29 ENCOUNTER — Telehealth: Payer: Self-pay

## 2013-03-29 NOTE — Telephone Encounter (Signed)
[name], have you seen her blood work yet?

## 2013-03-29 NOTE — Telephone Encounter (Signed)
03/29/2013  11:43 AM    Pt called to ask for Lab results for blood work done on 03/08/13 there in [place].  Tel: [phone], cell.

## 2013-04-04 ENCOUNTER — Encounter: Payer: Self-pay | Admitting: Surgical Oncology

## 2013-04-04 ENCOUNTER — Telehealth: Payer: Self-pay | Admitting: Surgical Oncology

## 2013-04-04 ENCOUNTER — Encounter: Payer: Self-pay | Admitting: Endocrinology

## 2013-04-04 NOTE — Telephone Encounter (Signed)
We now have the lab results scanned. Please respond to patient by mychart and not by phone.

## 2013-04-25 ENCOUNTER — Ambulatory Visit
Admit: 2013-04-25 | Discharge: 2013-04-25 | Disposition: A | Discharge status: Home / Self Care | Payer: Self-pay | Source: Ambulatory Visit | Attending: Endocrinology | Admitting: Endocrinology

## 2013-04-25 DIAGNOSIS — E213 Hyperparathyroidism, unspecified: Secondary | ICD-10-CM

## 2013-04-25 LAB — RENAL FUNCTION PANEL
Albumin: 5.1 g/dL (ref 3.5–5.2)
Anion Gap: 10 (ref 7–16)
CO2: 29 mmol/L — ABNORMAL HIGH (ref 20–28)
Calcium: 10.1 mg/dL (ref 8.6–10.2)
Chloride: 100 mmol/L (ref 96–108)
Creatinine: 0.71 mg/dL (ref 0.51–0.95)
GFR,Black: 107 *
GFR,Caucasian: 93 *
Glucose: 90 mg/dL (ref 60–99)
Lab: 15 mg/dL (ref 6–20)
Phosphorus: 3.9 mg/dL (ref 2.7–4.5)
Potassium: 4.7 mmol/L (ref 3.3–5.1)
Sodium: 139 mmol/L (ref 133–145)

## 2013-04-25 LAB — PTH, INTACT: Intact PTH: 43.9 pg/mL (ref 15.0–65.0)

## 2013-04-28 ENCOUNTER — Encounter: Payer: Self-pay | Admitting: Endocrinology

## 2013-04-28 LAB — VITAMIN D
25-OH VIT D2: <4 ng/mL
25-OH VIT D3: 65 ng/mL
25-OH Vit Total: 65 ng/mL — ABNORMAL HIGH (ref 30–60)

## 2013-08-29 ENCOUNTER — Ambulatory Visit
Admit: 2013-08-29 | Discharge: 2013-08-29 | Disposition: A | Discharge status: Home / Self Care | Payer: Self-pay | Source: Ambulatory Visit | Attending: Surgical Oncology | Admitting: Surgical Oncology

## 2013-08-29 LAB — WHOLE BLOOD CALCIUM IONIZED
ICA @7.4,WB: 4.8 mg/dL (ref 4.8–5.2)
ICA Uncorr,WB: 5.1 mg/dL

## 2013-08-29 LAB — PTH, INTACT: Intact PTH: 35.5 pg/mL (ref 15.0–65.0)

## 2013-08-29 LAB — CALCIUM: Calcium: 9.7 mg/dL (ref 8.6–10.2)

## 2013-08-31 LAB — VITAMIN D
25-OH VIT D2: <4 ng/mL
25-OH VIT D3: 35 ng/mL
25-OH Vit Total: 35 ng/mL (ref 30–60)

## 2013-09-27 ENCOUNTER — Encounter: Payer: Self-pay | Admitting: Surgical Oncology

## 2013-09-27 ENCOUNTER — Ambulatory Visit: Payer: Self-pay | Admitting: Surgical Oncology

## 2013-09-27 VITALS — BP 127/75 | HR 74 | Temp 95.4°F | Resp 16 | Ht 66.0 in | Wt 134.0 lb

## 2013-09-27 DIAGNOSIS — E210 Primary hyperparathyroidism: Secondary | ICD-10-CM

## 2013-09-27 NOTE — Progress Notes (Signed)
Dear DR. Borgejanania,    It was my pleasure to see [name] in follow -up after her left lower parathyroidectomy . Thank you for referring her to my endocrine surgery practice. As you know, surgery itself was uneventful. Her incision has healed beautifully.    Labs: Calcium 9.7 IonCa 4.8  PTH 35.5    I explained to the patient at today's visit that as she has recovered from her surgery they do not need to return to our Endocrine Surgical Clinic unless there are other needs or concerns from her surgery.      REVIEW OF SYSTEMS:  I did a complete review of systems was done using the [facility] 973 and it is negative.      Filed Vitals:    09/27/13 1250   BP: 127/75   Pulse: 74   Temp: 35.2 C (95.4 F)   Resp: 16   Height: 1.676 m (5\' 6" )   Weight: 60.782 kg (134 lb)       On physical exam, HEENT exam is PERRL.  The oral mucosa is pink, moist and intact, oral dentition is in good repair.  The neck is supple with no palpable lymphadenopathy.  The lungs are clear bilaterally to auscultation.  Cardiovascular exam is a regular rate and rhythm, S1 and S2; no S3 is appreciated.  The abdomen soft, non-tender, nondistended with positive bowel sounds in all 4 quadrants and no hepatosplenomegaly.  Extremities have positive pulses in all 4 extremities.  No clubbing cyanosis or edema.  The cranial nerves are grossly intact.  Rectal and breast exams are deferred at this time.      Dr.Borgejanania  , thank you very much for the referral and allowing [place] to participate in the care of your patient. At today's visit I explained to her that she does not need to return to our Endocrine Surgical Clinic unless there are other needs or concerns from her surgery.     [name] has recovered very nicely from surgery. Thank you so much for sending this patient to me and for allowing me to participate in her care.        Sincerely,  Larnell Granlund B [name], NP

## 2015-10-01 ENCOUNTER — Ambulatory Visit
Admission: RE | Admit: 2015-10-01 | Discharge: 2015-10-01 | Disposition: A | Discharge status: Home / Self Care | Payer: Self-pay | Source: Ambulatory Visit | Attending: Obstetrics and Gynecology | Admitting: Obstetrics and Gynecology

## 2015-10-01 LAB — COMPREHENSIVE METABOLIC PANEL
ALT: 17 U/L (ref 0–35)
AST: 21 U/L (ref 0–35)
Albumin: 4.8 g/dL (ref 3.5–5.2)
Alk Phos: 52 U/L (ref 35–105)
Anion Gap: 9 (ref 7–16)
Bilirubin,Total: 0.4 mg/dL (ref 0.0–1.2)
CO2: 29 mmol/L — ABNORMAL HIGH (ref 20–28)
Calcium: 9.8 mg/dL (ref 8.6–10.2)
Chloride: 105 mmol/L (ref 96–108)
Creatinine: 0.89 mg/dL (ref 0.51–0.95)
GFR,Black: 80 *
GFR,Caucasian: 70 *
Glucose: 85 mg/dL (ref 60–99)
Lab: 16 mg/dL (ref 6–20)
Potassium: 4.5 mmol/L (ref 3.3–5.1)
Sodium: 143 mmol/L (ref 133–145)
Total Protein: 7.1 g/dL (ref 6.3–7.7)

## 2015-10-01 LAB — CBC
Hematocrit: 41 % (ref 34–45)
Hemoglobin: 13.4 g/dL (ref 11.2–15.7)
MCH: 32 pg/cell (ref 26–32)
MCHC: 33 g/dL (ref 32–36)
MCV: 99 fL — ABNORMAL HIGH (ref 79–95)
Platelets: 208 10*3/uL (ref 160–370)
RBC: 4.2 MIL/uL (ref 3.9–5.2)
RDW: 12.8 % (ref 11.7–14.4)
WBC: 5.1 10*3/uL (ref 4.0–10.0)

## 2015-10-04 LAB — VITAMIN D
25-OH VIT D2: <4 ng/mL
25-OH VIT D3: 30 ng/mL
25-OH Vit Total: 30 ng/mL (ref 30–60)

## 2020-05-28 ENCOUNTER — Emergency Department: Payer: Medicare (Managed Care) | Admitting: Radiology

## 2020-05-28 ENCOUNTER — Emergency Department
Admission: EM | Admit: 2020-05-28 | Discharge: 2020-05-28 | Disposition: A | Discharge status: Home / Self Care | Payer: Medicare (Managed Care) | Source: Ambulatory Visit | Attending: Emergency Medicine | Admitting: Emergency Medicine

## 2020-05-28 DIAGNOSIS — J984 Other disorders of lung: Secondary | ICD-10-CM

## 2020-05-28 DIAGNOSIS — T148XXA Other injury of unspecified body region, initial encounter: Secondary | ICD-10-CM

## 2020-05-28 DIAGNOSIS — M25511 Pain in right shoulder: Secondary | ICD-10-CM

## 2020-05-28 DIAGNOSIS — M549 Dorsalgia, unspecified: Secondary | ICD-10-CM | POA: Insufficient documentation

## 2020-05-28 DIAGNOSIS — S46911A Strain of unspecified muscle, fascia and tendon at shoulder and upper arm level, right arm, initial encounter: Secondary | ICD-10-CM

## 2020-05-28 LAB — HM HIV SCREENING OFFERED

## 2020-05-28 MED ORDER — LIDOCAINE 5 % EX PTCH *I*
1.0000 | MEDICATED_PATCH | CUTANEOUS | 0 refills | Status: AC
Start: 2020-05-28 — End: 2020-06-03

## 2020-05-28 MED ORDER — OXYCODONE HCL 5 MG PO TABS *I*
5.0000 mg | ORAL_TABLET | Freq: Once | ORAL | Status: AC
Start: 2020-05-28 — End: 2020-05-28
  Administered 2020-05-28: 5 mg via ORAL
  Filled 2020-05-28: qty 1

## 2020-05-28 MED ORDER — LIDOCAINE 5 % EX PTCH *I*
1.0000 | MEDICATED_PATCH | Freq: Once | CUTANEOUS | Status: DC
Start: 2020-05-28 — End: 2020-05-28
  Administered 2020-05-28: 1 via TRANSDERMAL
  Filled 2020-05-28: qty 1

## 2020-05-28 MED ORDER — OXYCODONE HCL 10 MG PO TABS *I*
10.0000 mg | ORAL_TABLET | Freq: Three times a day (TID) | ORAL | 0 refills | Status: AC | PRN
Start: 2020-05-28 — End: 2020-06-01

## 2020-05-28 NOTE — ED Notes (Signed)
Plan of Care     Nursing Plan of Care: Will monitor and assess VS and pain scores every 2-4 hours and PRN, Perform frequent rounding PRN, provide updates to patient and/or caregiver, provider support to patient/caregiver as needed, teach patient and/or caregiver about patients needs/status working toward discharge. Call bell within reach, patient oriented to room

## 2020-05-28 NOTE — ED Triage Notes (Signed)
Pt presents to ED stating that she has had 1 week of R upper back/shoulder pain radiating into R arm. Denies injury. Pt was taking ibuprofen without relief. Pt attempted to see her orthopedic provider, but was unable to get an appointment.        Triage Note   Laverta Baltimore, RN

## 2020-05-28 NOTE — ED Provider Notes (Signed)
History     Chief Complaint   Patient presents with    Back Pain     69 year old female with a history of primary hyperparathyroidism, presents the emergency department with right posterior back and shoulder pain for the past week.  She lives in [place] and her and her husband drove up here last week to visit friends and relatives.  They were packing a lot of closing boxes in the car.  After she got in the car during the drive she noted pain to her posterior shoulder near her scapula.  The pain is worse with movement of her shoulder or torso.  She denies numbness or weakness in her right upper extremity.  She denies chest pain, shortness of breath, fever, cough.  She saw a massage therapist earlier this week but that did not help the pain.  She has been taking Tylenol ibuprofen without improvement in the pain.  She tried to get in to see her orthopedic doctor but since she is primarily living in [place] now she does not have any doctors here in the area.  Pain is worse at night.          Medical/Surgical/Family History     Past Medical History:   Diagnosis Date    Hyperparathyroidism         Patient Active Problem List   Diagnosis Code    Primary hyperparathyroidism s/p left parathyroidectomy on 02/07/2013 E21.0            Past Surgical History:   Procedure Laterality Date    HYSTERECTOMY, TOTAL ABDOMINAL      PARATHYROID GLAND SURGERY       History reviewed. No pertinent family history.       Social History     Tobacco Use    Smoking status: Never Smoker   Substance Use Topics    Alcohol use: No    Drug use: No     Living Situation     Questions Responses    Patient lives with Family    Homeless No    Caregiver for other family member No    External Services None    Employment     Domestic Violence Risk No                Review of Systems   Review of Systems   Constitutional: Negative for chills and fever.   Respiratory: Negative for cough, chest tightness and shortness of breath.    Cardiovascular:  Negative for chest pain.   Gastrointestinal: Negative for abdominal pain.   Musculoskeletal: Positive for arthralgias and myalgias. Negative for back pain, gait problem, joint swelling, neck pain and neck stiffness.       Physical Exam     Triage Vitals  Triage Start: Start, (05/28/20 1215)   First Recorded BP: (!) 180/97, Resp: 16, Temp: 36.5 C (97.7 F), Temp src: TEMPORAL Oxygen Therapy SpO2: 99 %, Oximetry Source: Rt Hand, O2 Device: None (Room air), Heart Rate: 80, (05/28/20 1218)  .  First Pain Reported  0-10 Scale: 4, Pain Location/Orientation: Back, (05/28/20 1218)       Physical Exam  Vitals and nursing note reviewed.   Constitutional:       General: She is not in acute distress.     Appearance: Normal appearance. She is not ill-appearing or toxic-appearing.   HENT:      Head: Normocephalic.   Eyes:      Conjunctiva/sclera: Conjunctivae normal.   Cardiovascular:  Rate and Rhythm: Normal rate and regular rhythm.      Pulses: Normal pulses.      Heart sounds: Normal heart sounds.   Pulmonary:      Effort: Pulmonary effort is normal.      Breath sounds: Normal breath sounds.   Abdominal:      Palpations: Abdomen is soft.      Tenderness: There is no abdominal tenderness.   Musculoskeletal:         General: Tenderness present. Normal range of motion.      Cervical back: Normal range of motion and neck supple.      Comments: Back: Tenderness over the medial edge of the scapula.  No ecchymosis.  No erythema.   Neurological:      General: No focal deficit present.      Mental Status: She is alert.         Medical Decision Making   Patient seen by me on:  05/28/2020    Assessment:  69 year old with right scapular and posterior shoulder pain that began after preparing for a trip.  Normal range of motion in the right upper extremity with normal distal neurovascular exam, tenderness over the medial edge of the scapula.    Differential diagnosis:  Muscle strain, fracture, sprain, right upper lobe mass    Plan:   Right shoulder x-ray, chest x-ray, ibuprofen, oxycodone, Lidoderm patch, reassess.    ED Course and Disposition:  X-rays normal.            Jarrett Ables, MD          Jarrett Ables, MD  05/28/20 1719

## 2020-05-28 NOTE — Discharge Instructions (Signed)
Take Acetaminophen or Ibuprofen for pain at doses recommended by the manufacturer.    If Ibuprofen/Motrin and Acetaminophen/Tylenol are not controlling your pain, take Oxycodone 1 tablet every 6 hours as needed. Do not drive, swim, or operate heavy machinery for at least 3 or 4 hours after taking this medication because it can make you drowsy.   If you do not use all of these tablets, please dispose of them appropriately by taking them to your local police station or other appropriate opiate disposal facility.    Apply the lidoderm patch and leave it in place for 12 hours.  You must take if off after 12 hours and leave it off for 12 hours before applying a new one.  Adjust when you are wearing it so that it is on when you need it the most.  Ask the pharmacist if your insurance will cover the cost of the prescription because some insurances do not.  If your insurance does not cover the cost, ask the pharmacist about an over the counter alternative.

## 2021-01-29 ENCOUNTER — Other Ambulatory Visit: Payer: Self-pay | Admitting: Gastroenterology

## 2021-03-11 ENCOUNTER — Encounter: Payer: Self-pay | Admitting: Gastroenterology

## 2021-04-28 ENCOUNTER — Other Ambulatory Visit: Payer: Self-pay | Admitting: Gastroenterology

## 2021-04-29 ENCOUNTER — Other Ambulatory Visit: Payer: Self-pay | Admitting: Gastroenterology

## 2021-04-29 IMAGING — MR MRI THORACIC SPINE WITHOUT CONTRAST
4 of 7 series · 13 of 48 positions shown · non-contrast
Comparison: None

MRI THORACIC SPINE WITHOUT CONTRAST, 04/29/2021 [DATE]: 
CLINICAL INDICATION: Right upper thoracic pain, intrascapular pain
TECHNIQUE: Sagittal T1, Sagittal T2, Sagittal STIR, Axial T2 and Axial T1 MR 
images of the thoracic were performed without intravenous contrast enhancement.

[Series 202: mobiview fused cor · coronal · 10.0mm · 0.88mm/px · 3 of 10 slices shown]
[im 1/10]
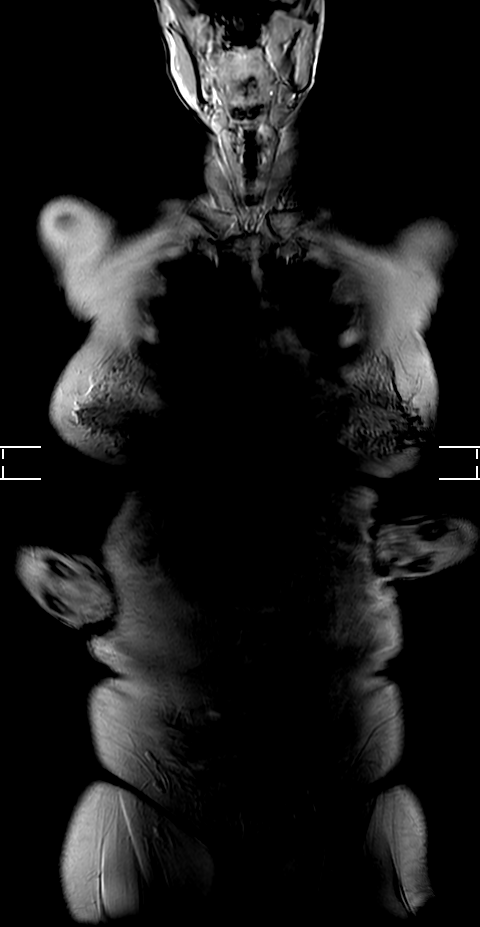
[im 5/10]
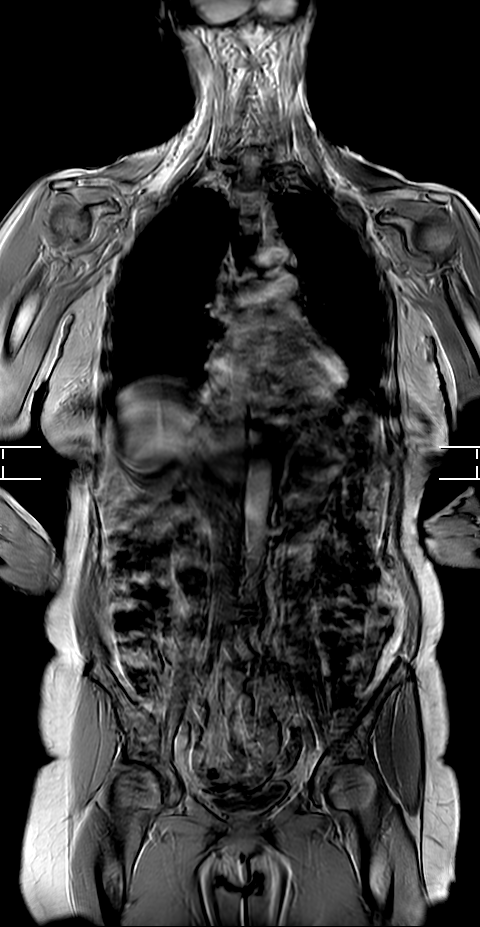
[im 10/10]
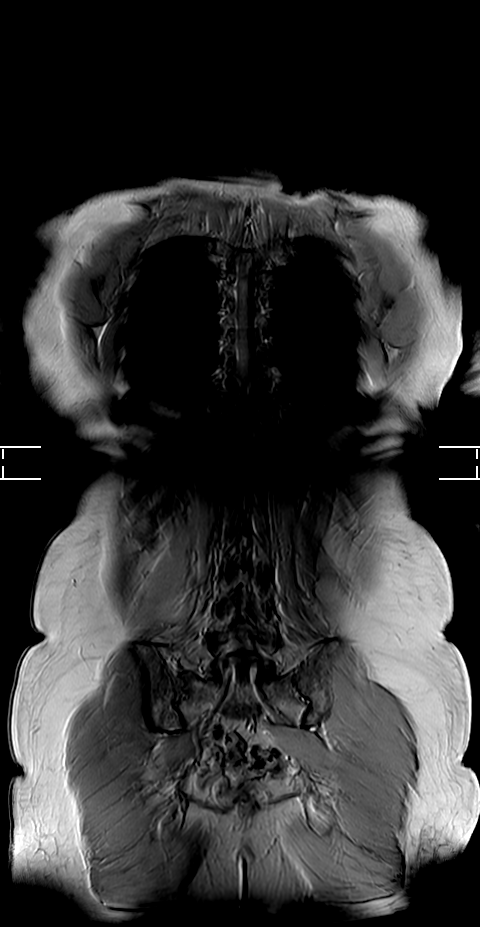

[Series 303: mobiview fused sag · sagittal · 5.5mm · 0.66mm/px · 4 of 15 slices shown]
[im 1/15]
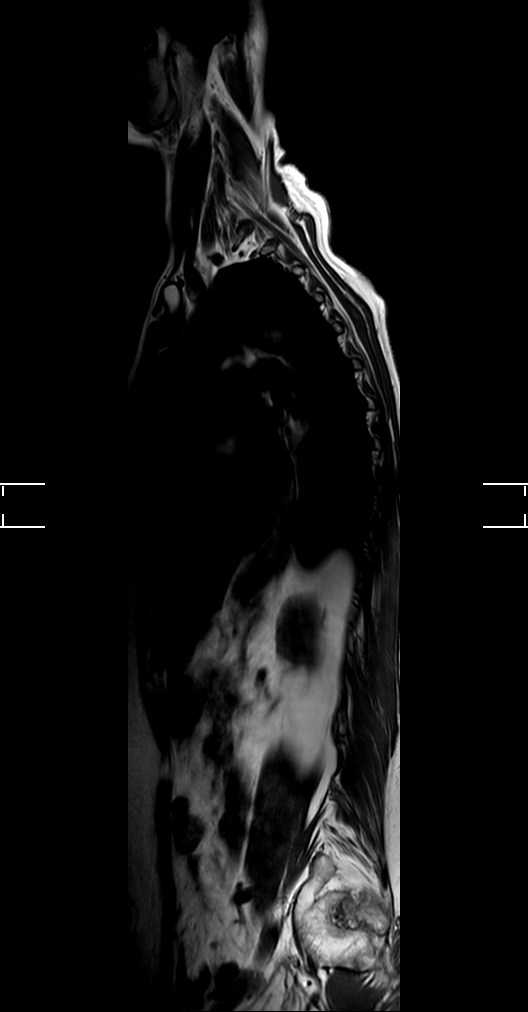
[im 4/15]
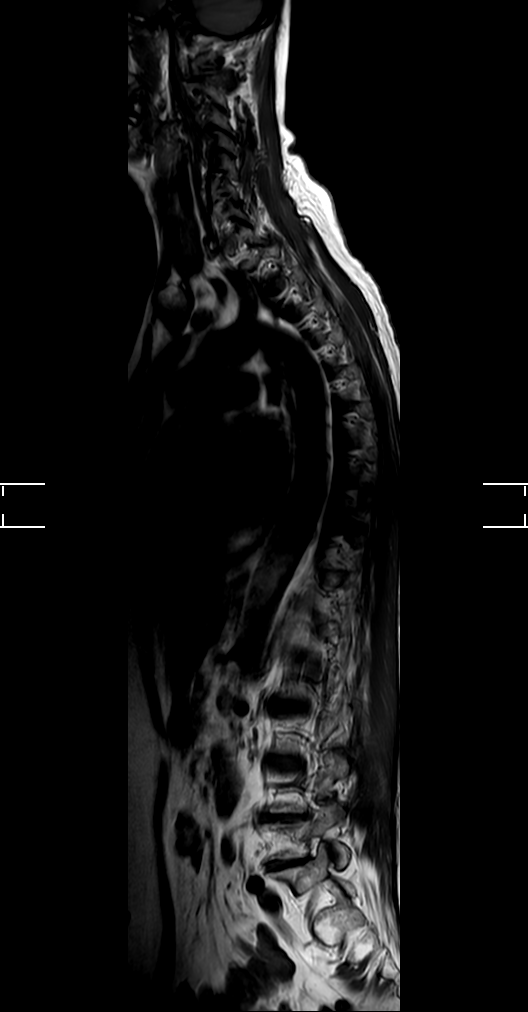
[im 8/15]
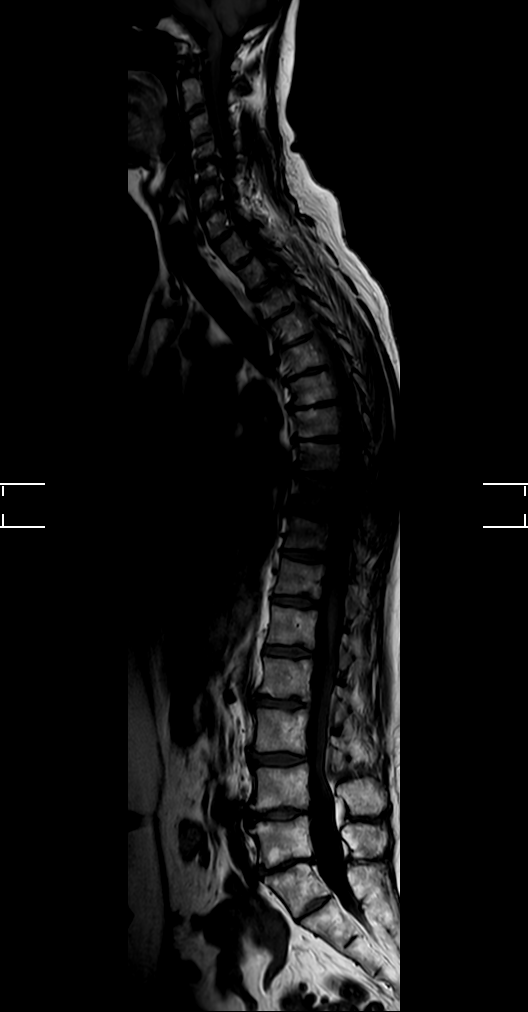
[im 15/15]
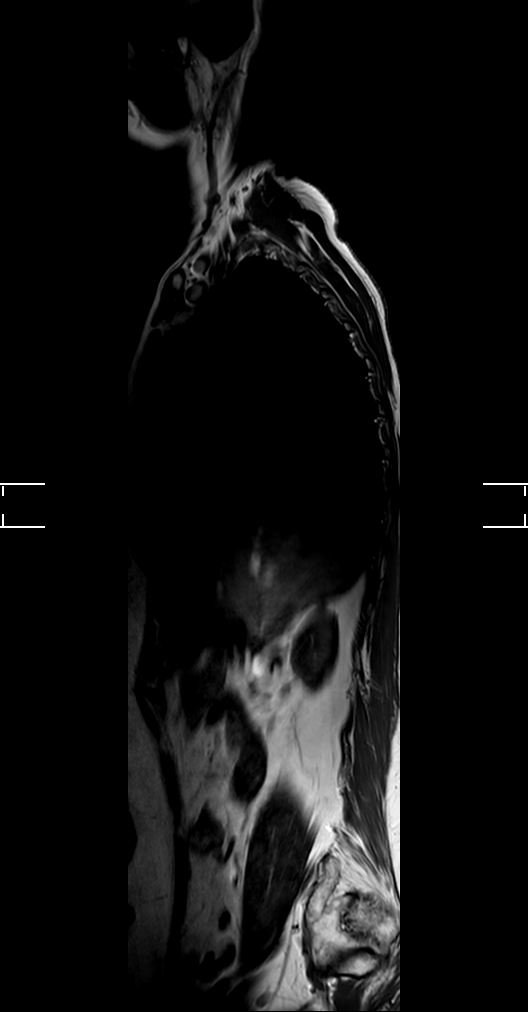

[Series 401: t1w_tse sag · sagittal · 3.0mm · 0.70mm/px · 3 of 21 slices shown]
[im 4/21]
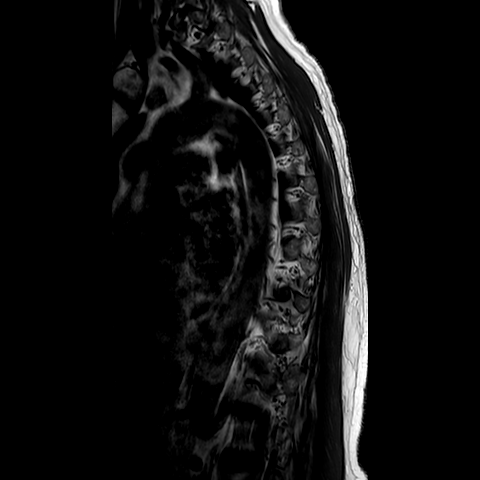
[im 11/21]
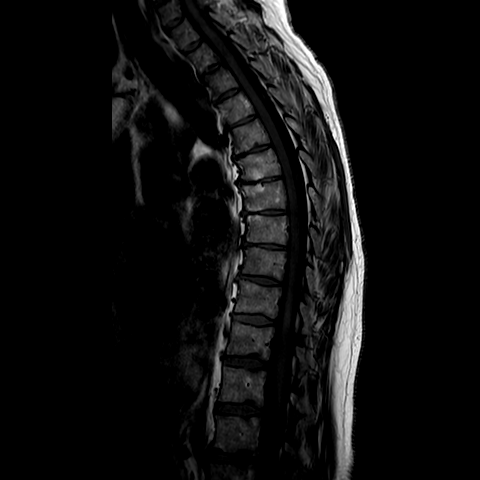
[im 17/21]
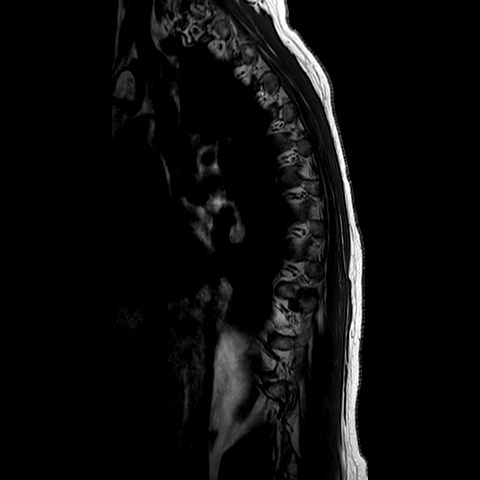

[Series 501: t2w_tse sag · sagittal · 3.0mm · 0.75mm/px · 3 of 21 slices shown]
[im 4/21]
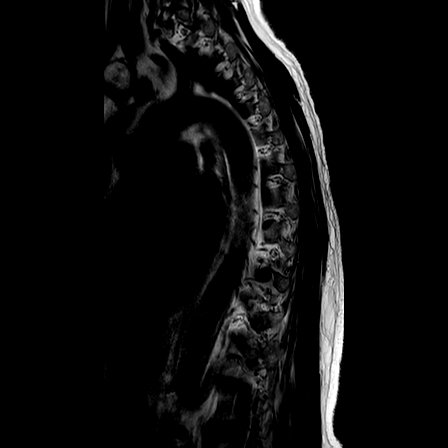
[im 11/21]
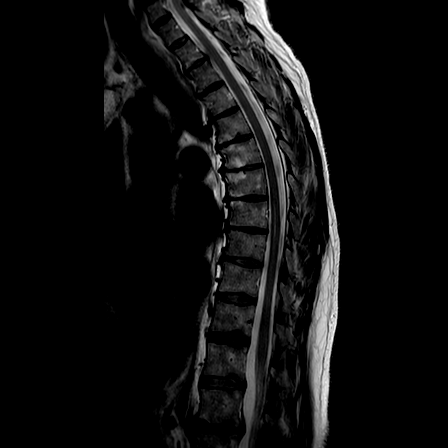
[im 17/21]
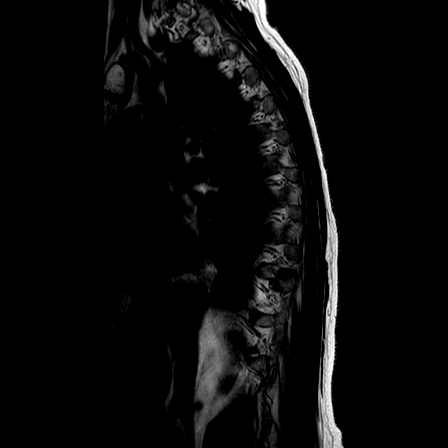

[13 of 48 positions shown; findings below may reference images not displayed]

FINDINGS: There is marked disc narrowing at T2-3. There is reactive edema at 
this level best seen on sagittal STIR image 10. No other reactive edema. No 
compression fracture. No evidence for malignancy. 
There is moderate to marked disc narrowing elsewhere throughout the mid to upper 
thoracic spine. 
At T2-3 there is posterior disc bulge indenting the ventral thecal sac, 
appearing to approximate the cord without deformity. There is loss of normal fat 
signal in the right T2-3 foramen, and as seen on sagittal images [DATE] there is 
hypointense signal in the medial right foramen, also seen on axial image 18. 
This likely represents disc-osteophyte. There is a 3 mm cystic focus seen on 
axial T2 image 19, not well corroborated on sagittal views. There appears to be 
impingement of the right T2 nerve root. The left foramen is open. 
No other significant foraminal stenosis. There is slight dilatation of the 
central canal of the spinal cord opposite T8-9, best seen on sagittal image 11, 
axial T2 image 21. Maximum diameter is 1 mm. This most likely represents forme 
fruste of developmental hydromyelia, and is of unlikely clinical significance. 
No other intramedullary signal abnormality.
IMPRESSION: Disc degenerative change at T2-3. Disc bulge approximates the cord at this 
level. There is reactive endplate edema/Modic type I. 
Right foraminal stenosis at T2-3 due to what appears to be disc-osteophyte, 
impinging the right T2 nerve root. 
Degenerative changes elsewhere, including marked disc narrowing at T5-6 and T6-7 
with endplate irregularity and chronic endplate marrow change. 
Note made of minimal dilatation of the central canal of the mid thoracic spinal 
cord. This is likely developmental hydromyelia, of unlikely clinical 
significance. No evidence for neoplasm.

## 2021-05-16 ENCOUNTER — Encounter: Payer: Self-pay | Admitting: Gastroenterology

## 2021-06-13 ENCOUNTER — Encounter: Payer: Self-pay | Admitting: Student in an Organized Health Care Education/Training Program

## 2021-06-13 ENCOUNTER — Emergency Department
Admission: EM | Admit: 2021-06-13 | Discharge: 2021-06-13 | Disposition: A | Discharge status: Home / Self Care | Payer: Medicare (Managed Care) | Source: Ambulatory Visit | Attending: Emergency Medicine | Admitting: Emergency Medicine

## 2021-06-13 DIAGNOSIS — R059 Cough, unspecified: Secondary | ICD-10-CM

## 2021-06-13 DIAGNOSIS — L259 Unspecified contact dermatitis, unspecified cause: Secondary | ICD-10-CM

## 2021-06-13 DIAGNOSIS — R21 Rash and other nonspecific skin eruption: Secondary | ICD-10-CM | POA: Insufficient documentation

## 2021-06-13 LAB — CBC AND DIFFERENTIAL
Baso # K/uL: 0.1 10*3/uL (ref 0.0–0.1)
Basophil %: 0.8 %
Eos # K/uL: 0 10*3/uL (ref 0.0–0.4)
Eosinophil %: 0.4 %
Hematocrit: 38 % (ref 34–45)
Hemoglobin: 12.9 g/dL (ref 11.2–15.7)
IMM Granulocytes #: 0 10*3/uL (ref 0.0–0.0)
IMM Granulocytes: 0.3 %
Lymph # K/uL: 2.5 10*3/uL (ref 1.2–3.7)
Lymphocyte %: 24.6 %
MCH: 31 pg (ref 26–32)
MCHC: 34 g/dL (ref 32–36)
MCV: 93 fL (ref 79–95)
Mono # K/uL: 0.4 10*3/uL (ref 0.2–0.9)
Monocyte %: 4.4 %
Neut # K/uL: 7 10*3/uL — ABNORMAL HIGH (ref 1.6–6.1)
Nucl RBC # K/uL: 0 10*3/uL (ref 0.0–0.0)
Nucl RBC %: 0 /100 WBC (ref 0.0–0.2)
Platelets: 353 10*3/uL (ref 160–370)
RBC: 4.1 MIL/uL (ref 3.9–5.2)
RDW: 13.1 % (ref 11.7–14.4)
Seg Neut %: 69.5 %
WBC: 10.1 10*3/uL — ABNORMAL HIGH (ref 4.0–10.0)

## 2021-06-13 LAB — PLASMA PROF 7 (ED ONLY)
Anion Gap,PL: 13 (ref 7–16)
CO2,Plasma: 27 mmol/L (ref 20–28)
Chloride,Plasma: 103 mmol/L (ref 96–108)
Creatinine: 0.72 mg/dL (ref 0.51–0.95)
Glucose,Plasma: 140 mg/dL — ABNORMAL HIGH (ref 60–99)
Potassium,Plasma: 3.5 mmol/L (ref 3.3–4.6)
Sodium,Plasma: 143 mmol/L (ref 133–145)
UN,Plasma: 17 mg/dL (ref 6–20)
eGFR BY CREAT: 91 *

## 2021-06-13 LAB — CRP: CRP: <3 mg/L (ref 0–8)

## 2021-06-13 MED ORDER — DEXTROSE 5 % FLUSH FOR PUMPS *I*
0.0000 mL/h | INTRAVENOUS | Status: DC | PRN
Start: 2021-06-13 — End: 2021-06-13

## 2021-06-13 MED ORDER — TRIAMCINOLONE ACETONIDE 0.1 % EX CREA *I*
TOPICAL_CREAM | Freq: Two times a day (BID) | CUTANEOUS | 0 refills | Status: AC
Start: 2021-06-13 — End: 2021-07-13

## 2021-06-13 MED ORDER — SODIUM CHLORIDE 0.9 % FLUSH FOR PUMPS *I*
0.0000 mL/h | INTRAVENOUS | Status: DC | PRN
Start: 2021-06-13 — End: 2021-06-13

## 2021-06-13 NOTE — ED Triage Notes (Signed)
Pt c/o wound check. Pt reports that she was bitten by a tsete fly on R hand, now having a rash on hand, burning sensation. Rash also appeared on L hand x1 day. Seen at [facility] and sent here. No other c/o.            Prehospital medications given: No

## 2021-06-13 NOTE — Discharge Instructions (Signed)
Please begin triamcinolone cream twice a day for ~10 days to bilateral hand rash for possible contact dermatitis.    You were seen by the ID team who did not feel rash related to fly bite. You may follow up with ID team if you develop any other concerning symptoms.     Please also follow up with PCP.     Return to the ER for any concern.

## 2021-06-13 NOTE — First Provider Contact (Signed)
ED First Provider Contact Note    Initial provider evaluation performed by   ED First Provider Contact     Date/Time Event User Comments    06/13/21 [phone] ED First Provider Contact Dinnis Rog Initial Face to Face Provider Contact          Vital signs reviewed.    Assessment:   [name] is a 70 y.o. female here for a wounds check. Pt reports that in weeks past was bitten by a Tsetse Fly over her right hand pointer finger.  Over the past couple days she has developed some redness and dark streak over this hand as well as the left hand.  No systemic symptoms.      Patient requires further evaluation.     Talmage Coin, MD, 06/13/2021, 9:37 AM     Talmage Coin, MD  Resident  06/13/21 [phone]

## 2021-06-13 NOTE — Provider Consult (Signed)
INFECTIOUS DISEASES CONSULT NOTE    Consult Requested By: Sanda Linger, PA     Question: Skin lesions in a returning traveler     Chief Complaint: Rash      HPI: Ms. [name] is a 70 year old female patient with past medical history of hyperparathyroidism. Patient presented to [facility] ED today due to rash/skin lesions on bilateral thumbs and index fingers following insect bite in [place].     History obtained by patient at the bedside. Patient reports that she is healthy and does not take any medications. Patient states that approximately three weeks ago while traveling in [place]. Patient reports she visited the countries of [place], [place], [place], [place]. During her travels, patient was taking photographs when she was bit by a tsetse fly on her right index finger. Patient mentions it was a tsetse fly as she was told by her tour guide that there were tsetse flies in the area. Patient notes she took malaria prophylaxis as prescribed and denies additional insect bites. Denies other exposures. Additional events include testing positive for COVID-19. Symptoms described as "common cold symptoms" with no fevers or chills, shortness of breath. Tested negative within three days and describes she returned healthy to the [place].     This past week patient reports noting an erythematous rash bilateral thumbs and index fingers near site of bite. Patient states this was in association with attempting to open a bottle which also caused a small abrasion right index finger. Patient describes rash as "burning". Due to concern for infection, patient took three doses of ciprofloxacin which had been prescribed to her by PCP prior to her travels and sought medical attention at Urgent Care today. Subsequently referred to [facility] ED for further evaluation.     On initial presentation to [facility] today, patient hemodynamically stable, afebrile. Noted to have minimal leukocytosis and CRP within normal limits. No  microbiology data and no imaging.     On evaluation today, patient states she has continued with dry cough following COVID-19, otherwise feeling well, denies fever or chills, headaches, vision changes, cough, shortness of breath, chest pain, nausea, emesis, abdominal pain, diarrhea, blood in stool, dysuria, hematuria, night sweats, unintentional weight loss.     History:    Past Medical History:   Diagnosis Date    Hyperparathyroidism      No family history on file.    Social History     Socioeconomic History    Marital status: Married     Spouse name: Not on file    Number of children: Not on file    Years of education: Not on file    Highest education level: Not on file   Tobacco Use    Smoking status: Never Smoker    Smokeless tobacco: Not on file   Substance and Sexual Activity    Alcohol use: No    Drug use: No    Sexual activity: Not on file   Other Topics Concern    Not on file   Social History Narrative    Not on file     No Known Allergies (drug, envir, food or latex)    Medications:     Scheduled Meds:  Continuous Infusions:  PRN Meds:.sodium chloride, dextrose    ROS: 12 systems reviewed and negative except as stated above in HPI     Physical Exam:    BP: (129-162)/(79-89)   Temp:  [36.5 C (97.7 F)-36.8 C (98.2 F)]   Temp  src: Temporal (06/17 1455)  Heart Rate:  [80-94]   Resp:  [16-20]   SpO2:  [99 %]   Height:  [165.1 cm (_0 )]   Weight:  [64.4 kg (142 lb)]   General Appearance: Female patient of stated age, sitting comfortably in chair, in no acute distress.  HEENT: Normocephalic, atraumatic head. Mucous membranes moist. No oral lesions noted. Neck supple. No cervical lymphadenopathy.   Cardiovascular: Regular rate and rhythm. No murmurs, rubs, or gallops.   Pulmonary: Respiratory effort normal on ambient air. Bilateral breath sounds clear on auscultation. No wheezing, rales, or rhonchi.   Abdomen: Soft, nondistended, nontender. Bowel sounds normoactive.   Extremities: No edema.    Neurologic: Alert and oriented. Grossly, no focal neurologic deficits noted.   Skin: Erythema noted over right thumb, right index finger, left thumb, left index finger. Small excoriation noted right index finger. No edema, warmth, or drainage noted. Photos uploaded on Epic reviewed.   Mood: Appropriate.    Laboratory and Imaging Data:    Recent Labs   Lab 06/13/21  1338   WBC 10.1*   Hemoglobin 12.9   Hematocrit 38   Platelets 353     Recent Labs   Lab 06/13/21  1338   Creatinine 0.72     No results for input(s): TP, ALB, ALT, AST, ALK, TB, DB, GGT in the last 8760 hours.    Recent Labs   Lab 06/13/21  1338   CRP <3     No results found for: ESR    No imaging.     Microbiology Data:    None     Antimicrobial Therapy:    None     ASSESSMENT: 70 year old female patient with past medical history of hyperparathyroidism, who presents to [facility] ED today due to rash/skin lesions on bilateral thumbs and index fingers following insect bite in [place]. Patient otherwise asymptomatic. Skin lesions not consistent with lesions or rash following tsetse fly bite (typically chancre or circinate erythematous patches). In addition, no signs to suggest SSTI thus no antimicrobial therapy is warranted. Findings seem to be more consistent with contact dermatitis and reasonable to trail topical corticosteroids with outpatient follow-up.     PLAN/RECOMMENDATIONS:  -No further infectious work-up or antimicrobial therapy at this time   -Consider low potency topical corticosteroids  -Follow-up with Infectious Diseases PRN     Recommendations communicated to covering provider.      Patient seen and discussed with Dr. William Hamburger.    Thank you for the opportunity to be involved in this patient's care. Infectious Diseases will sign off.     Darl Pikes, MD  Fellow, Infectious Diseases    For any questions or concerns, please page Infectious Diseases Team 2.

## 2021-06-13 NOTE — ED Notes (Signed)
AVS reviewed and patient verbalized understanding. VSS, IV removed. Pharmacy for medication confirmed. Patient to be transported home by husband has access to residence, and is dressed in weather appropriate clothing. All belongings with patient at time of discharge. Patient ambulated to door.

## 2021-06-13 NOTE — ED Provider Notes (Signed)
History     Chief Complaint   Patient presents with    Rash     70 yo F with history notable for hyperparathyroidism who presents to the ED for evaluation of Tsetse fly bite she sustained on ~ 05/26/2021 while traveling in [place]. She reports she was taking a picture of the Tsetse fly when it bit her on right index finger. She developed a little erythema around bite day after bite but no other symptoms. She states over last few days she noticed a rash to her left hand. She denies any fevers or chills. Endorses some nausea. Of note, tested positive for Covid 2 weeks ago while in [place] and still has lingering cough. Sent in from urgent care for evaluation.       History provided by:  Patient and medical records  Language interpreter used: No        Medical/Surgical/Family History     Past Medical History:   Diagnosis Date    Hyperparathyroidism         Patient Active Problem List   Diagnosis Code    Primary hyperparathyroidism s/p left parathyroidectomy on 02/07/2013 E21.0            Past Surgical History:   Procedure Laterality Date    HYSTERECTOMY, TOTAL ABDOMINAL      PARATHYROID GLAND SURGERY       No family history on file.       Social History     Tobacco Use    Smoking status: Never Smoker    Smokeless tobacco: Not on file   Substance Use Topics    Alcohol use: No    Drug use: No     Living Situation     Questions Responses    Patient lives with Family    Homeless No    Caregiver for other family member No    External Services None    Employment     Domestic Violence Risk No                Review of Systems   Review of Systems   Constitutional: Negative for chills and fever.   Respiratory: Positive for cough. Negative for shortness of breath.    Cardiovascular: Negative for chest pain.   Gastrointestinal: Negative for abdominal pain, nausea and vomiting.   Musculoskeletal: Negative for back pain.   Skin: Positive for wound.   Allergic/Immunologic: Negative for immunocompromised state.    Neurological: Negative for dizziness and headaches.   Hematological: Does not bruise/bleed easily.   Psychiatric/Behavioral: Negative for agitation.       Physical Exam     Triage Vitals  Triage Start: Start, (06/13/21 0935)   First Recorded BP: 162/89, Resp: 16, Temp: 36.5 C (97.7 F), Temp src: TEMPORAL Oxygen Therapy SpO2: 99 %, Oximetry Source: Rt Hand, O2 Device: None (Room air), Heart Rate: 88, (06/13/21 0940)  .  First Pain Reported  0-10 Scale: 0, (06/13/21 0940)       Physical Exam  Vitals and nursing note reviewed.   Constitutional:       Appearance: Normal appearance.   HENT:      Head: Normocephalic and atraumatic.   Eyes:      Conjunctiva/sclera: Conjunctivae normal.   Cardiovascular:      Rate and Rhythm: Normal rate.      Pulses: Normal pulses.      Heart sounds: Normal heart sounds.   Pulmonary:      Effort: Pulmonary effort  is normal. No respiratory distress.      Breath sounds: Normal breath sounds.   Neurological:      Mental Status: She is alert.   Psychiatric:         Mood and Affect: Mood normal.         Behavior: Behavior normal.         Thought Content: Thought content normal.         Judgment: Judgment normal.         Right Hand Erythema rash over right index finger and left thumb without drainage, significant warmth, or edema.       Left Hand:Scattered erythematous rash over thumb and index finger as depicted.        Medical Decision Making   Patient seen by me on:  06/13/2021    Assessment:  70 yo F with history notable for hyperparathyroidism who presents to the ED for evaluation of Tsetse fly bite she sustained on ~ 05/26/2021 while traveling in [place]. Patient now with rash to both hands.     Differential diagnosis:  Contact dermatitis, "sleeping sickness" (parasitic infection), cellulitis less likely     Plan:  ID consult  CBC, ED7, CRP     ED Course and Disposition:  Seen by ID team - see consult note. At this time, low concern rash is related to fly bite.   Will treat for  likely contact dermatitis with triamcinolone cream bid  Can follow up with ID as needed, also recommend close PCP follow up   Patient in agreement with plan             Deetta Perla, PA          Deetta Perla, [place]  06/13/21 1447

## 2023-02-09 IMAGING — MR MRI ABDOMEN W/WO CONTRAST
15 of 19 series · 29 of 48 positions shown · IV contrast (gadolinium)
Comparison: None

________________________________________________________________________________________________ 
MRI ABDOMEN W/WO CONTRAST, 02/09/2023 [DATE]: 
CLINICAL INDICATION: Elevated liver enzymes previous cholecystectomy
TECHNIQUE: Multiplanar, multiecho position MR images of the abdomen were 
performed without and with intravenous gadolinium enhancement.  7 mL of Gadavist 
were injected intravenously by hand. 0.5 mL of Gadavist were discarded. Patient 
was scanned on a 1.5T magnet.

[Series 101: survey-head 1st · axial · 15.0mm · 1.76mm/px · 1 of 15 slices shown]
[im 1/15]
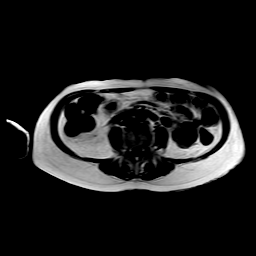

[Series 201: T2 · coronal · 5.0mm · 0.73mm/px · 1 of 34 slices shown]
[im 1/34]
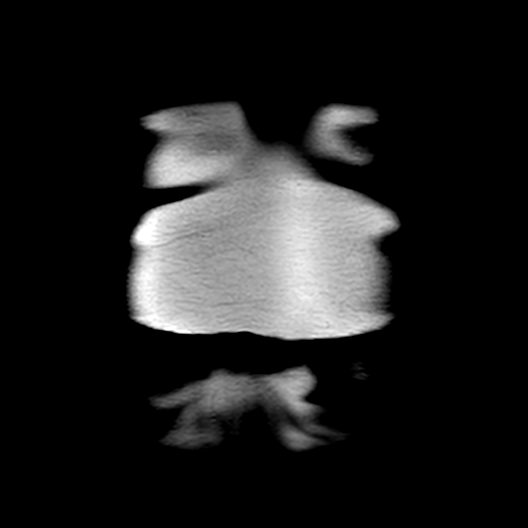

[Series 302: sout of phase · axial · 6.0mm · 1.10mm/px · 1 of 36 slices shown]
[im 1/36]
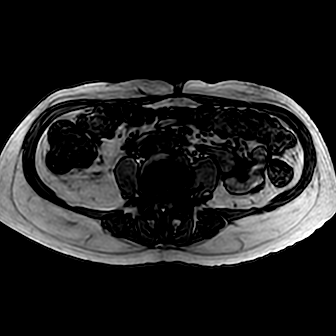

[Series 303: sin phase · axial · 6.0mm · 1.10mm/px · 1 of 36 slices shown]
[im 1/36]
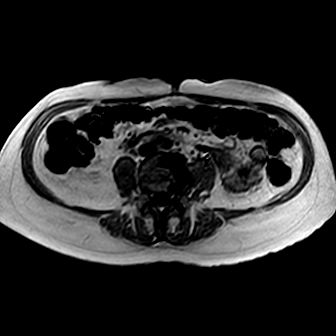

[Series 401: t2_ax_mvxd_hr_rt · axial · 5.0mm · 0.72mm/px · 1 of 40 slices shown]
[im 1/40]
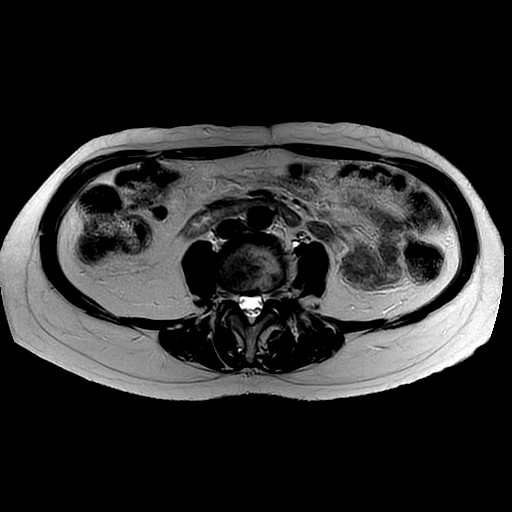

[Series 501: t2_spair mvxd_rt_fast · axial · 5.0mm · 0.77mm/px · 1 of 40 slices shown]
[im 1/40]
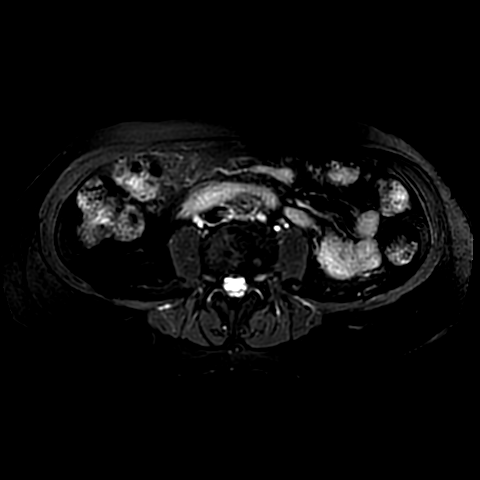

[Series 602: sbo · axial · 5.0mm · 1.70mm/px · 1 of 44 slices shown]
[im 1/44]
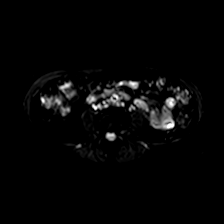

[Series 603: (id) · axial · 5.0mm · 1.70mm/px · 1 of 44 slices shown]
[im 1/44]
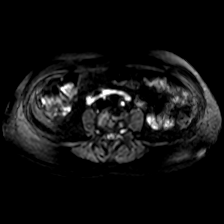

[Series 604: dadc 600 · axial · 5.0mm · 1.70mm/px · 1 of 44 slices shown]
[im 1/44]
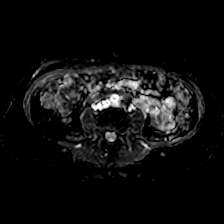

[Series 702: DIXON · axial · 4.0mm · 0.86mm/px · z∈[-53,+185]mm · 3 of 120 slices shown (1 of 5)]
[im 1/120]
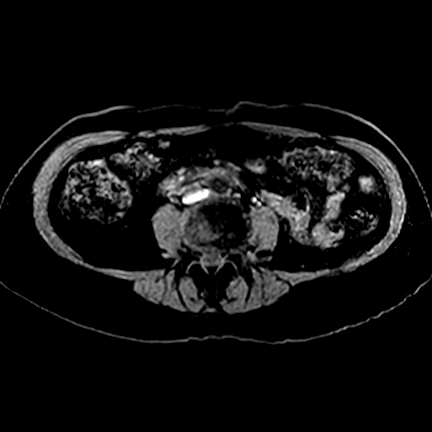
[im 60/120]
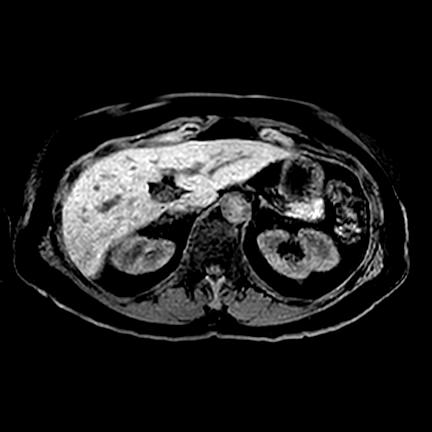
[im 120/120]
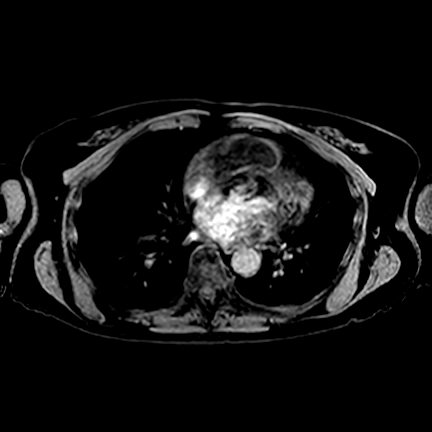

[Series 703: DIXON · axial · 4.0mm · 0.86mm/px · z∈[-53,+185]mm · 4 of 120 slices shown (2 of 5)]
[im 1/120]
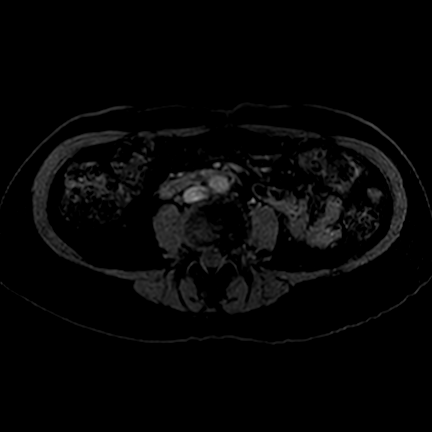
[im 40/120]
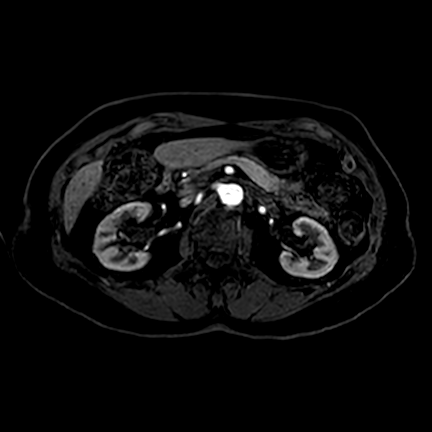
[im 80/120]
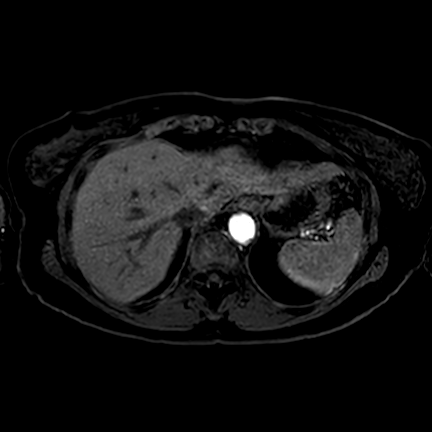
[im 120/120]
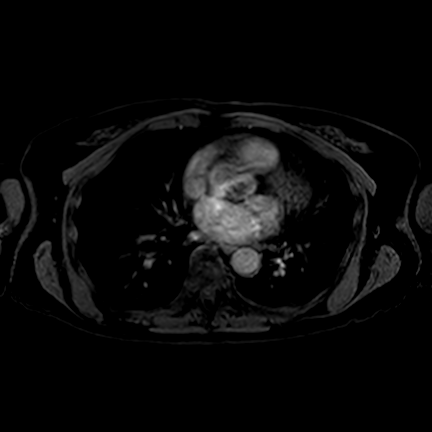

[Series 704: DIXON · axial · 4.0mm · 0.86mm/px · z∈[-53,+185]mm · 4 of 120 slices shown (3 of 5)]
[im 1/120]
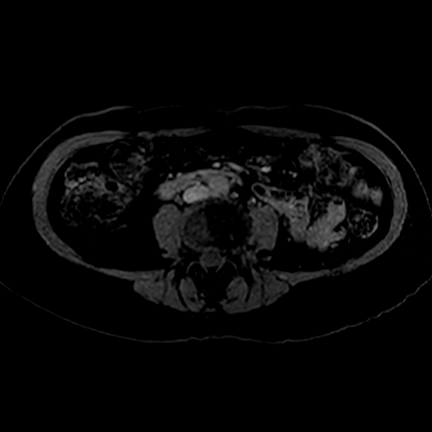
[im 40/120]
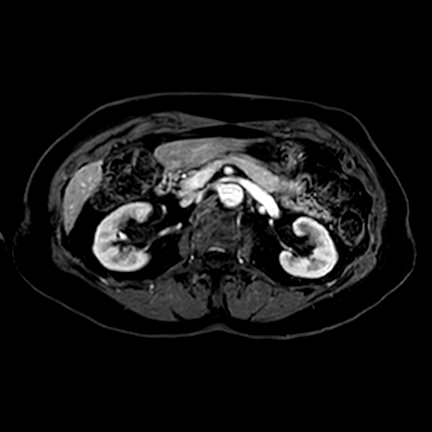
[im 80/120]
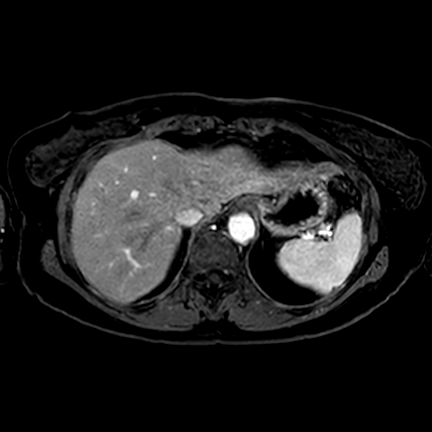
[im 120/120]
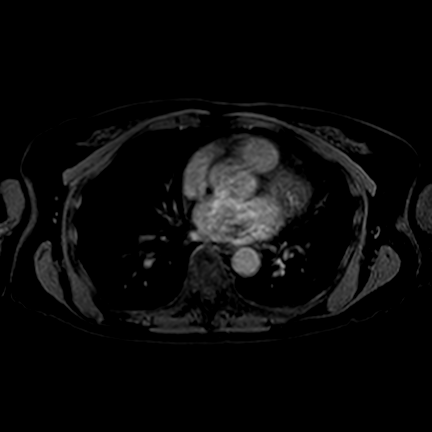

[Series 705: DIXON · axial · 4.0mm · 0.86mm/px · z∈[-53,+185]mm · 4 of 120 slices shown (4 of 5)]
[im 1/120]
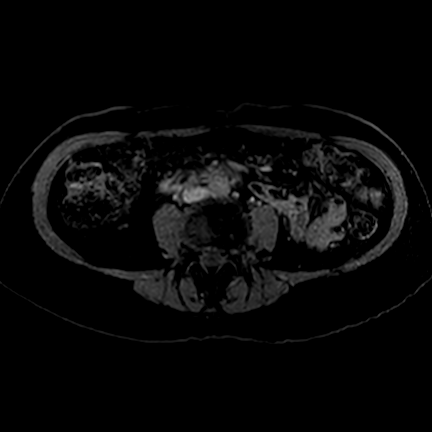
[im 40/120]
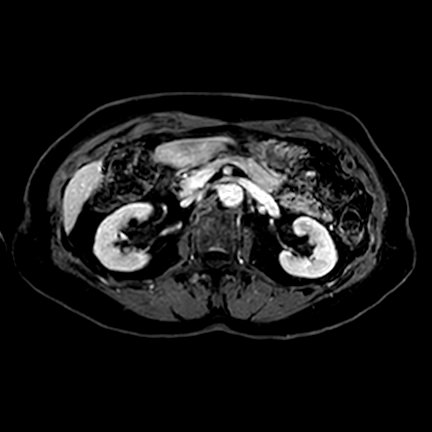
[im 80/120]
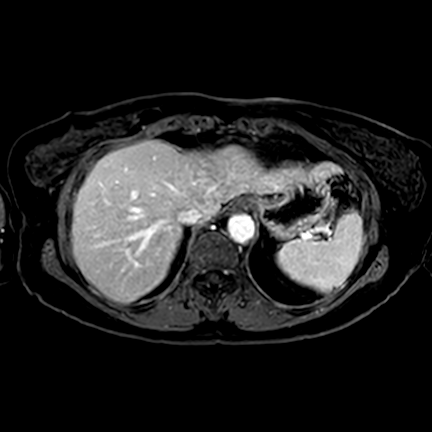
[im 120/120]
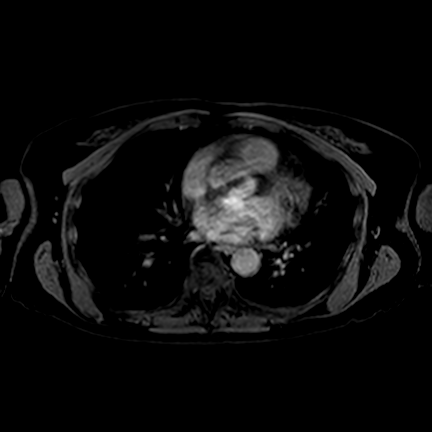

[Series 706: DIXON · axial · 4.0mm · 0.86mm/px · z∈[-53,+185]mm · 4 of 120 slices shown (5 of 5)]
[im 1/120]
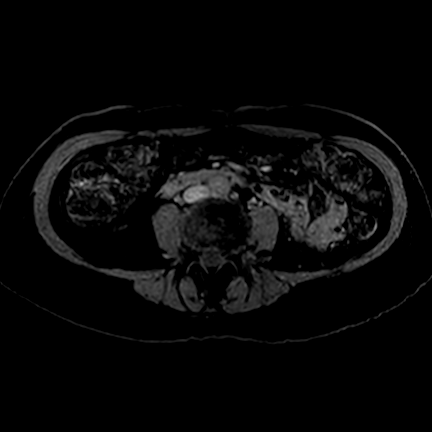
[im 40/120]
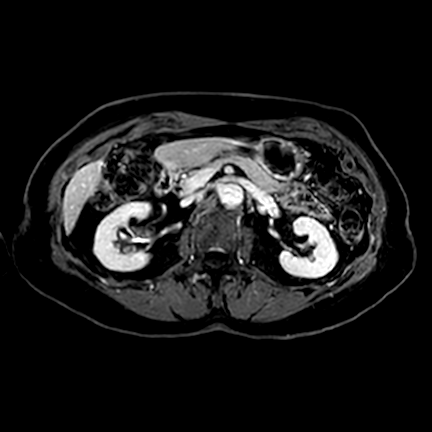
[im 80/120]
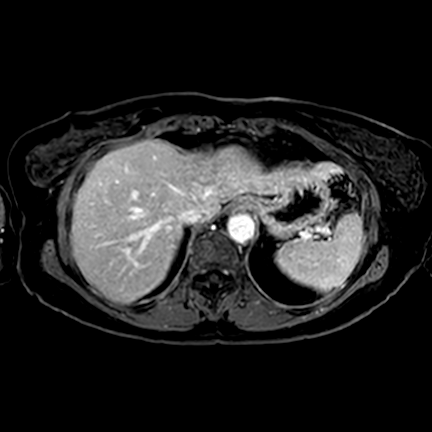
[im 120/120]
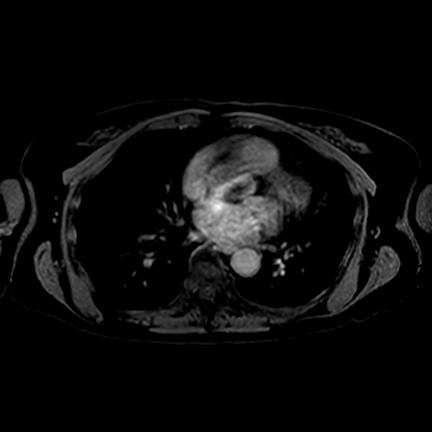

[Series 707: DIXON post-contrast · axial · 4.0mm · 0.86mm/px · 1 of 120 slices shown]
[im 1/120]
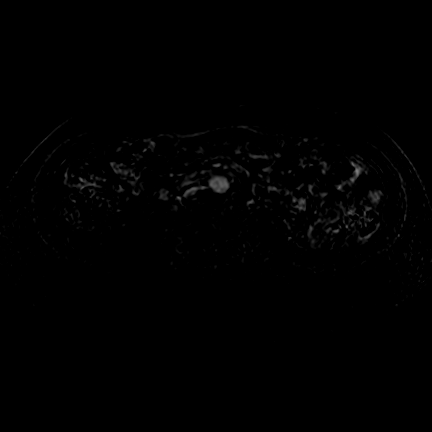

[29 of 48 positions shown; findings below may reference images not displayed]

FINDINGS: Previous cholecystectomy. The liver is normal in appearance. There is 
no abnormal enhancement. No signal abnormality. No dilated intrahepatic bile 
ducts. The portal and hepatic veins all appear to be widely patent. 
The pancreas, spleen, adrenal glands and kidneys are normal in appearance. Small 
cyst are seen in the kidneys. No adenopathy. No free fluid identified.
IMPRESSION: No acute abnormality identified. Previous cholecystectomy.

## 5575-05-29 DEATH — deceased
